# Patient Record
Sex: Female | Born: 1945 | Race: White | Hispanic: No | Marital: Married | Smoking: Former smoker
Health system: Southern US, Community
[De-identification: ages and names within clinical notes are randomized; demographics above are authoritative.]

## PROBLEM LIST (undated history)

## (undated) DIAGNOSIS — E785 Hyperlipidemia, unspecified: Secondary | ICD-10-CM

## (undated) DIAGNOSIS — C719 Malignant neoplasm of brain, unspecified: Secondary | ICD-10-CM

## (undated) HISTORY — PX: OTHER SURGICAL HISTORY: SHX169

## (undated) HISTORY — DX: Hyperlipidemia, unspecified: E78.5

---
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
## 1998-06-01 ENCOUNTER — Other Ambulatory Visit: Admission: RE | Admit: 1998-06-01 | Discharge: 1998-06-01 | Payer: Self-pay | Admitting: Obstetrics and Gynecology

## 1998-08-10 ENCOUNTER — Ambulatory Visit (HOSPITAL_COMMUNITY): Admission: RE | Admit: 1998-08-10 | Discharge: 1998-08-10 | Payer: Self-pay | Admitting: Obstetrics and Gynecology

## 1999-06-14 ENCOUNTER — Other Ambulatory Visit: Admission: RE | Admit: 1999-06-14 | Discharge: 1999-06-14 | Payer: Self-pay | Admitting: Obstetrics and Gynecology

## 2000-04-18 ENCOUNTER — Encounter (INDEPENDENT_AMBULATORY_CARE_PROVIDER_SITE_OTHER): Payer: Self-pay

## 2000-04-18 ENCOUNTER — Ambulatory Visit (HOSPITAL_COMMUNITY): Admission: RE | Admit: 2000-04-18 | Discharge: 2000-04-18 | Payer: Self-pay | Admitting: Obstetrics and Gynecology

## 2000-07-03 ENCOUNTER — Other Ambulatory Visit: Admission: RE | Admit: 2000-07-03 | Discharge: 2000-07-03 | Payer: Self-pay | Admitting: Obstetrics and Gynecology

## 2001-08-25 ENCOUNTER — Other Ambulatory Visit: Admission: RE | Admit: 2001-08-25 | Discharge: 2001-08-25 | Payer: Self-pay | Admitting: Obstetrics and Gynecology

## 2002-09-27 ENCOUNTER — Other Ambulatory Visit: Admission: RE | Admit: 2002-09-27 | Discharge: 2002-09-27 | Payer: Self-pay | Admitting: Obstetrics and Gynecology

## 2003-10-18 ENCOUNTER — Other Ambulatory Visit: Admission: RE | Admit: 2003-10-18 | Discharge: 2003-10-18 | Payer: Self-pay | Admitting: Obstetrics and Gynecology

## 2004-12-25 ENCOUNTER — Other Ambulatory Visit: Admission: RE | Admit: 2004-12-25 | Discharge: 2004-12-25 | Payer: Self-pay | Admitting: Obstetrics and Gynecology

## 2005-02-12 ENCOUNTER — Ambulatory Visit: Payer: Self-pay | Admitting: Endocrinology

## 2005-02-20 ENCOUNTER — Ambulatory Visit: Payer: Self-pay | Admitting: Gastroenterology

## 2005-03-25 ENCOUNTER — Ambulatory Visit: Payer: Self-pay | Admitting: Gastroenterology

## 2006-06-25 ENCOUNTER — Ambulatory Visit: Payer: Self-pay | Admitting: Endocrinology

## 2007-07-01 ENCOUNTER — Ambulatory Visit: Payer: Self-pay | Admitting: Endocrinology

## 2007-07-01 LAB — CONVERTED CEMR LAB
ALT: 34 units/L (ref 0–35)
Alkaline Phosphatase: 44 units/L (ref 39–117)
BUN: 14 mg/dL (ref 6–23)
Bilirubin, Direct: 0.1 mg/dL (ref 0.0–0.3)
Calcium: 9.6 mg/dL (ref 8.4–10.5)
GFR calc Af Amer: 132 mL/min
GFR calc non Af Amer: 109 mL/min
Glucose, Bld: 128 mg/dL — ABNORMAL HIGH (ref 70–99)
LDL Cholesterol: 95 mg/dL (ref 0–99)
Total CHOL/HDL Ratio: 2.4

## 2007-07-25 ENCOUNTER — Encounter: Payer: Self-pay | Admitting: *Deleted

## 2007-07-25 DIAGNOSIS — M810 Age-related osteoporosis without current pathological fracture: Secondary | ICD-10-CM | POA: Insufficient documentation

## 2007-07-25 DIAGNOSIS — E785 Hyperlipidemia, unspecified: Secondary | ICD-10-CM | POA: Insufficient documentation

## 2007-07-25 DIAGNOSIS — E139 Other specified diabetes mellitus without complications: Secondary | ICD-10-CM | POA: Insufficient documentation

## 2008-08-19 ENCOUNTER — Telehealth (INDEPENDENT_AMBULATORY_CARE_PROVIDER_SITE_OTHER): Payer: Self-pay | Admitting: *Deleted

## 2008-08-19 ENCOUNTER — Ambulatory Visit: Payer: Self-pay | Admitting: Endocrinology

## 2008-08-19 LAB — CONVERTED CEMR LAB
ALT: 30 units/L (ref 0–35)
AST: 25 units/L (ref 0–37)
Albumin: 4.5 g/dL (ref 3.5–5.2)
Alkaline Phosphatase: 45 units/L (ref 39–117)
BUN: 15 mg/dL (ref 6–23)
Bilirubin, Direct: 0.2 mg/dL (ref 0.0–0.3)
CO2: 31 meq/L (ref 19–32)
Chloride: 106 meq/L (ref 96–112)
Cholesterol: 181 mg/dL (ref 0–200)
Crystals: NEGATIVE
Glucose, Bld: 105 mg/dL — ABNORMAL HIGH (ref 70–99)
Ketones, ur: NEGATIVE mg/dL
LDL Cholesterol: 117 mg/dL — ABNORMAL HIGH (ref 0–99)
Mucus, UA: NEGATIVE
Potassium: 4.2 meq/L (ref 3.5–5.1)
RBC / HPF: NONE SEEN
Specific Gravity, Urine: <=1.005 (ref 1.000–1.03)
Total Protein, Urine: NEGATIVE mg/dL
Total Protein: 7.6 g/dL (ref 6.0–8.3)
Triglycerides: 46 mg/dL (ref 0–149)
Urine Glucose: NEGATIVE mg/dL
Urobilinogen, UA: 0.2 (ref 0.0–1.0)

## 2008-08-29 ENCOUNTER — Telehealth: Payer: Self-pay | Admitting: Endocrinology

## 2008-10-04 ENCOUNTER — Telehealth: Payer: Self-pay | Admitting: Endocrinology

## 2009-03-16 ENCOUNTER — Ambulatory Visit: Payer: Self-pay | Admitting: Family Medicine

## 2009-03-17 ENCOUNTER — Telehealth: Payer: Self-pay | Admitting: Family Medicine

## 2009-03-22 ENCOUNTER — Telehealth: Payer: Self-pay | Admitting: Family Medicine

## 2009-05-15 ENCOUNTER — Telehealth: Payer: Self-pay | Admitting: Family Medicine

## 2009-06-20 ENCOUNTER — Ambulatory Visit: Payer: Self-pay | Admitting: Family Medicine

## 2009-06-26 ENCOUNTER — Telehealth: Payer: Self-pay | Admitting: Family Medicine

## 2009-07-24 ENCOUNTER — Telehealth: Payer: Self-pay | Admitting: Family Medicine

## 2009-08-15 ENCOUNTER — Telehealth: Payer: Self-pay | Admitting: Family Medicine

## 2009-11-16 ENCOUNTER — Ambulatory Visit: Payer: Self-pay | Admitting: Family Medicine

## 2009-11-16 LAB — CONVERTED CEMR LAB
BUN: 7 mg/dL (ref 6–23)
CO2: 27 meq/L (ref 19–32)
Chloride: 101 meq/L (ref 96–112)
GFR calc non Af Amer: 107.66 mL/min (ref >60)
Glucose, Bld: 109 mg/dL — ABNORMAL HIGH (ref 70–99)
Potassium: 3.8 meq/L (ref 3.5–5.1)
Sodium: 137 meq/L (ref 135–145)

## 2009-11-22 ENCOUNTER — Ambulatory Visit: Payer: Self-pay | Admitting: Family Medicine

## 2010-04-16 ENCOUNTER — Ambulatory Visit: Payer: Self-pay | Admitting: Family Medicine

## 2010-04-16 LAB — CONVERTED CEMR LAB
Alkaline Phosphatase: 41 units/L (ref 39–117)
BUN: 16 mg/dL (ref 6–23)
Bilirubin Urine: NEGATIVE
Bilirubin, Direct: 0.1 mg/dL (ref 0.0–0.3)
Calcium: 9.2 mg/dL (ref 8.4–10.5)
Chloride: 112 meq/L (ref 96–112)
Cholesterol: 160 mg/dL (ref 0–200)
Creatinine, Ser: 0.5 mg/dL (ref 0.4–1.2)
Eosinophils Absolute: 0.1 10*3/uL (ref 0.0–0.7)
Eosinophils Relative: 1.6 % (ref 0.0–5.0)
HDL: 70.3 mg/dL (ref >39.00)
Hgb A1c MFr Bld: 5.9 % (ref 4.6–6.5)
Ketones, urine, test strip: NEGATIVE
LDL Cholesterol: 82 mg/dL (ref 0–99)
Lymphocytes Relative: 51 % — ABNORMAL HIGH (ref 12.0–46.0)
MCHC: 34.7 g/dL (ref 30.0–36.0)
MCV: 92.1 fL (ref 78.0–100.0)
Microalb Creat Ratio: 1.3 mg/g (ref 0.0–30.0)
Microalb, Ur: 0.5 mg/dL (ref 0.0–1.9)
Monocytes Absolute: 0.5 10*3/uL (ref 0.1–1.0)
Neutrophils Relative %: 36.9 % — ABNORMAL LOW (ref 43.0–77.0)
Nitrite: NEGATIVE
Platelets: 253 10*3/uL (ref 150.0–400.0)
Protein, U semiquant: NEGATIVE
RBC: 3.96 M/uL (ref 3.87–5.11)
Total Bilirubin: 0.6 mg/dL (ref 0.3–1.2)
Total CHOL/HDL Ratio: 2
Triglycerides: 37 mg/dL (ref 0.0–149.0)
Urobilinogen, UA: 0.2
VLDL: 7.4 mg/dL (ref 0.0–40.0)
WBC: 4.7 10*3/uL (ref 4.5–10.5)

## 2010-04-23 ENCOUNTER — Ambulatory Visit: Payer: Self-pay | Admitting: Family Medicine

## 2010-04-23 LAB — HM DIABETES FOOT EXAM: HM Diabetic Foot Exam: NORMAL

## 2010-08-15 ENCOUNTER — Telehealth: Payer: Self-pay | Admitting: Family Medicine

## 2010-10-15 ENCOUNTER — Ambulatory Visit: Payer: Self-pay | Admitting: Family Medicine

## 2010-10-17 LAB — CONVERTED CEMR LAB
CO2: 29 meq/L (ref 19–32)
Chloride: 108 meq/L (ref 96–112)
Creatinine, Ser: 0.7 mg/dL (ref 0.4–1.2)
Hgb A1c MFr Bld: 5.8 % (ref 4.6–6.5)
Potassium: 4.5 meq/L (ref 3.5–5.1)
Sodium: 143 meq/L (ref 135–145)

## 2010-10-24 ENCOUNTER — Ambulatory Visit: Payer: Self-pay | Admitting: Family Medicine

## 2010-12-02 LAB — CONVERTED CEMR LAB
BUN: 12 mg/dL (ref 6–23)
Basophils Relative: 0.3 % (ref 0.0–3.0)
Bilirubin Urine: NEGATIVE
CO2: 29 meq/L (ref 19–32)
Eosinophils Relative: 1.1 % (ref 0.0–5.0)
GFR calc non Af Amer: 90.31 mL/min (ref >60)
Glucose, Bld: 117 mg/dL — ABNORMAL HIGH (ref 70–99)
Glucose, Urine, Semiquant: NEGATIVE
HCT: 40.8 % (ref 36.0–46.0)
Ketones, urine, test strip: NEGATIVE
MCV: 92.7 fL (ref 78.0–100.0)
Monocytes Absolute: 0.4 10*3/uL (ref 0.1–1.0)
Monocytes Relative: 8.6 % (ref 3.0–12.0)
Neutrophils Relative %: 45.9 % (ref 43.0–77.0)
Platelets: 273 10*3/uL (ref 150.0–400.0)
Potassium: 4.5 meq/L (ref 3.5–5.1)
Protein, U semiquant: NEGATIVE
RBC: 4.41 M/uL (ref 3.87–5.11)
Sodium: 142 meq/L (ref 135–145)
Specific Gravity, Urine: 1.01
Urobilinogen, UA: 0.2
WBC: 5.1 10*3/uL (ref 4.5–10.5)

## 2010-12-06 NOTE — Assessment & Plan Note (Signed)
Summary: 6 month fup//ccm---PT [facility] // RS   Vital Signs:  Patient profile:   65 year old female Menstrual status:  postmenopausal Weight:      137 pounds Temp:     98.0 degrees F oral BP sitting:   100 / 68  (left arm) Cuff size:   regular CC: 6 mon rov   CC:  6 mon rov.  History of Present Illness: Diane Perez is a delightful, 65 year old, married female, nonsmoker, who comes in today for evaluation of diabetes, type II.  Her last physical examination and laboratory 2011 sugar blood sugar of 115 with a hemoglobin A1c of 5.7.  She's always been under excellent control.  She , not obese.  She takes metformin 500 mg daily  Review of systems otherwise negative  Allergies: No Known Drug Allergies  Past History:  Past medical, surgical, family and social histories (including risk factors) reviewed for relevance to current acute and chronic problems.  Past Medical History: Reviewed history from 07/25/2007 and no changes required. Diabetes mellitus, type II Hyperlipidemia Osteoporosis  Family History: Reviewed history from 03/16/2009 and no changes required. father died in his 20s of old age and asthma mother died in her 3s from underlying diabetes  one brother in good healthone sisters had bypass surgery at age 86.  The other in good health.  Social History: Reviewed history from 03/16/2009 and no changes required. Occupation:  travel agent Married Never Smoked Alcohol use-yes Drug use-no Regular exercise-yes  Physical Exam  General:  Well-developed,well-nourished,in no acute distress; alert,appropriate and cooperative throughout examination   Impression & Recommendations:  Problem # 1:  DIABETES MELLITUS, TYPE II (ICD-250.00) Assessment Improved  Her updated medication list for this problem includes:    Metformin Hcl 500 Mg Tb24 (Metformin hcl) .Marland Kitchen... Take 1 by mouth qd    Aspirin 81 Mg Tbec (Aspirin) .Marland Kitchen... Take one tab by mouth once daily  Complete Medication  List: 1)  Multivitamins Tabs (Multiple vitamin) .... Take 1 by mouth qd 2)  Metformin Hcl 500 Mg Tb24 (Metformin hcl) .... Take 1 by mouth qd 3)  Lovastatin 40 Mg Tabs (Lovastatin) .... Take 1 by mouth once daily 4)  Boniva 150 Mg Tabs (Ibandronate sodium) .... Take 1 by mouth once a month 5)  One Touch Delica Lancets Misc (Lancets) .... Use to test for glucose 6)  Onetouch Test Strp (Glucose blood) .... Use to test glucose  dx 250.0 7)  Ativan 0.5 Mg Tabs (Lorazepam) .Marland Kitchen.. 1 by mouth < flying 8)  Ra Calcium Plus Vitamin D 600-125 Mg-unit Tabs (Calcium-vitamin d) .... Take one tab by mouth once daily 9)  Aspirin 81 Mg Tbec (Aspirin) .... Take one tab by mouth once daily 10)  Fish Oil Oil (Fish oil) .... Take one tab by mouth once daily 11)  Vinegar  .... Take 2 tabs by mouth once daily  Patient Instructions: 1)  since your blood sugar is under such good control.  I would simply check a fasting blood sugar once a week. 2)  Return in June for your annual exam...250.00 3)  BMP prior to visit, ICD-9: 4)  Hepatic Panel prior to visit, ICD-9: 5)  Lipid Panel prior to visit, ICD-9: 6)  TSH prior to visit, ICD-9: 7)  CBC w/ Diff prior to visit, ICD-9: 8)  Urine-dip prior to visit, ICD-9: 9)  HbgA1C prior to visit, ICD-9: 10)  Urine Microalbumin prior to visit, ICD-9:   Orders Added: 1)  Est. Patient Level III [91478]

## 2010-12-06 NOTE — Progress Notes (Signed)
Summary: bs running in 150 range  Phone Note Call from Patient Call back at Home Phone [phone] Call back at Work Phone [phone]   Caller: Patient Call For: Roderick Pee MD Summary of Call: Pt bs is running in 150,  please advise Initial call taken by: Heron Sabins,  August 15, 2010 10:18 AM  Follow-up for Phone Call        advised to do continued at 500-mg dose before breakfast at 250 mg prior to her evening meal.  Fasting blood sugar q.a.m., fax me data in 3 weeks Follow-up by: Roderick Pee MD,  August 15, 2010 10:30 AM

## 2010-12-06 NOTE — Assessment & Plan Note (Signed)
Summary: cpx/mm   Vital Signs:  Patient profile:   65 year old female Menstrual status:  postmenopausal Height:      61.75 inches Weight:      132 pounds BMI:     24.43 Temp:     98.3 degrees F oral BP sitting:   94 / 68  (left arm) Cuff size:   regular  Vitals Entered By: Kern Reap CMA Duncan Dull) (April 23, 2010 9:41 AM) CC: cpx  Vision Screening:      Vision Comments: 05/2011   CC:  cpx.  History of Present Illness: Diane Perez is a 65 year old, married female, nonsmoker, who comes in today for follow-up of diabetes, hyper lipidemia, and osteoporosis.  Her diabetes is treated with metformin 500 mg daily.  She follows a strict diet and exercises on a regular basis.  Blood sugar 115.  Hemoglobin A1c of 5.9%.  She also takes the lovastatin 40 mg daily for hyperlipidemia.  Lipids are at goal  This is the third, year she's been on boniva  for osteoporosis.  Recommend she stop the boniva  follow-up bone density in two years  Tetanus booster 2010 pelvic by GYN, exam, July 2011, colonoscopy, May 2006 normal, annual mammography August 2011, normal.  She also takes calcium and vitamin D, and 81 mg baby aspirin daily  Diabetes Management History:      She says that she is exercising.    Allergies (verified): No Known Drug Allergies  Past History:  Past medical, surgical, family and social histories (including risk factors) reviewed, and no changes noted (except as noted below).  Past Medical History: Reviewed history from 07/25/2007 and no changes required. Diabetes mellitus, type II Hyperlipidemia Osteoporosis  Family History: Reviewed history from 03/16/2009 and no changes required. father died in his 63s of old age and asthma mother died in her 60s from underlying diabetes  one brother in good healthone sisters had bypass surgery at age 64.  The other in good health.  Social History: Reviewed history from 03/16/2009 and no changes required. Occupation:  travel  agent Married Never Smoked Alcohol use-yes Drug use-no Regular exercise-yes  Review of Systems      See HPI  Physical Exam  General:  Well-developed,well-nourished,in no acute distress; alert,appropriate and cooperative throughout examination Head:  Normocephalic and atraumatic without obvious abnormalities. No apparent alopecia or balding. Eyes:  No corneal or conjunctival inflammation noted. EOMI. Perrla. Funduscopic exam benign, without hemorrhages, exudates or papilledema. Vision grossly normal. Ears:  External ear exam shows no significant lesions or deformities.  Otoscopic examination reveals clear canals, tympanic membranes are intact bilaterally without bulging, retraction, inflammation or discharge. Hearing is grossly normal bilaterally. Nose:  External nasal examination shows no deformity or inflammation. Nasal mucosa are pink and moist without lesions or exudates. Mouth:  Oral mucosa and oropharynx without lesions or exudates.  Teeth in good repair. Neck:  No deformities, masses, or tenderness noted. Chest Wall:  No deformities, masses, or tenderness noted. Breasts:  No mass, nodules, thickening, tenderness, bulging, retraction, inflamation, nipple discharge or skin changes noted.   Lungs:  Normal respiratory effort, chest expands symmetrically. Lungs are clear to auscultation, no crackles or wheezes. Heart:  Normal rate and regular rhythm. S1 and S2 normal without gallop, murmur, click, rub or other extra sounds. Abdomen:  Bowel sounds positive,abdomen soft and non-tender without masses, organomegaly or hernias noted. Msk:  No deformity or scoliosis noted of thoracic or lumbar spine.   Pulses:  R and L carotid,radial,femoral,dorsalis pedis and  posterior tibial pulses are full and equal bilaterally Extremities:  No clubbing, cyanosis, edema, or deformity noted with normal full range of motion of all joints.   Neurologic:  No cranial nerve deficits noted. Station and gait are  normal. Plantar reflexes are down-going bilaterally. DTRs are symmetrical throughout. Sensory, motor and coordinative functions appear intact. Skin:  Intact without suspicious lesions or rashes Cervical Nodes:  No lymphadenopathy noted Axillary Nodes:  No palpable lymphadenopathy Inguinal Nodes:  No significant adenopathy Psych:  Cognition and judgment appear intact. Alert and cooperative with normal attention span and concentration. No apparent delusions, illusions, hallucinations  Diabetes Management Exam:    Foot Exam (with socks and/or shoes not present):       Sensory-Pinprick/Light touch:          Left medial foot (L-4): normal          Left dorsal foot (L-5): normal          Left lateral foot (S-1): normal          Right medial foot (L-4): normal          Right dorsal foot (L-5): normal          Right lateral foot (S-1): normal       Sensory-Monofilament:          Left foot: normal          Right foot: normal       Inspection:          Left foot: normal          Right foot: normal       Nails:          Left foot: normal          Right foot: normal    Eye Exam:       Eye Exam done elsewhere          Date: 05/04/2010          Results: normal          Done by: Ginette Otto ortho   Impression & Recommendations:  Problem # 1:  OSTEOPOROSIS (ICD-733.00) Assessment Improved  Her updated medication list for this problem includes:    Boniva 150 Mg Tabs (Ibandronate sodium) .Marland Kitchen... Take 1 by mouth once a month  Orders: Prescription Created Electronically [phone])  Problem # 2:  HYPERLIPIDEMIA (ICD-272.4) Assessment: Improved  Her updated medication list for this problem includes:    Lovastatin 40 Mg Tabs (Lovastatin) .Marland Kitchen... Take 1 by mouth once daily  Orders: Prescription Created Electronically [phone]) EKG w/ Interpretation (93000)  Problem # 3:  DIABETES MELLITUS, TYPE II (ICD-250.00) Assessment: Improved  Her updated medication list for this problem includes:     Metformin Hcl 500 Mg Tb24 (Metformin hcl) .Marland Kitchen... Take 1 by mouth qd    Aspirin 81 Mg Tbec (Aspirin) .Marland Kitchen... Take one tab by mouth once daily  Orders: Prescription Created Electronically [phone]) EKG w/ Interpretation (93000)  Complete Medication List: 1)  Multivitamins Tabs (Multiple vitamin) .... Take 1 by mouth qd 2)  Metformin Hcl 500 Mg Tb24 (Metformin hcl) .... Take 1 by mouth qd 3)  Lovastatin 40 Mg Tabs (Lovastatin) .... Take 1 by mouth once daily 4)  Boniva 150 Mg Tabs (Ibandronate sodium) .... Take 1 by mouth once a month 5)  One Touch Delica Lancets Misc (Lancets) .... Use to test for glucose 6)  Onetouch Test Strp (Glucose blood) .... Use to test glucose  dx 250.0 7)  Ativan  0.5 Mg Tabs (Lorazepam) .Marland Kitchen.. 1 by mouth < flying 8)  Ra Calcium Plus Vitamin D 600-125 Mg-unit Tabs (Calcium-vitamin d) .... Take one tab by mouth once daily 9)  Aspirin 81 Mg Tbec (Aspirin) .... Take one tab by mouth once daily 10)  Fish Oil Oil (Fish oil) .... Take one tab by mouth once daily 11)  Vinegar  .... Take 2 tabs by mouth once daily  Diabetes Management Assessment/Plan:      Her blood pressure goal is < 130/80.    Patient Instructions: 1)  Schedule your mammogram. 2)  Take calcium +Vitamin D daily. 3)  Take an Aspirin every day. 4)  Please schedule a follow-up appointment in 6 months. 250.00 5)  BMP prior to visit, ICD-9: 6)  HbgA1C prior to visit, ICD-9: 7)  Please schedule a follow-up appointment in 1 year. 8)  stop the boniva ........ continue calcium, vitamin D, and walking.... follow-up bone density in two years 9)  check your blood sugar weekly Prescriptions: ONETOUCH TEST  STRP (GLUCOSE BLOOD) use to test glucose  dx 250.0  #100 x 3   Entered and Authorized by:   Roderick Pee MD   Signed by:   Roderick Pee MD on 04/23/2010   Method used:   Electronically to        [facility] [phone]* (retail)       [address]       [place], [place]  [address]       Ph: [phone] or  [phone]       Fax: [phone]   RxID:   [id number] ONE TOUCH DELICA LANCETS  MISC (LANCETS) use to test for glucose  #100 x 3   Entered and Authorized by:   Roderick Pee MD   Signed by:   Roderick Pee MD on 04/23/2010   Method used:   Electronically to        [facility] [phone]* (retail)       [address]       [place], [place]  [address]       Ph: [phone] or [phone]       Fax: [phone]   RxID:   [id number] LOVASTATIN 40 MG  TABS (LOVASTATIN) take 1 by mouth once daily  #100 x 3   Entered and Authorized by:   Roderick Pee MD   Signed by:   Roderick Pee MD on 04/23/2010   Method used:   Electronically to        [facility] [phone]* (retail)       [address]       [place], [place]  [address]       Ph: [phone] or [phone]       Fax: [phone]   RxID:   [id number] METFORMIN HCL 500 MG  TB24 (METFORMIN HCL) take 1 by mouth qd  #100 x 3   Entered and Authorized by:   Roderick Pee MD   Signed by:   Roderick Pee MD on 04/23/2010   Method used:   Electronically to        [facility] [phone]* (retail)       [address]       [place], [place]  [address]       Ph: [phone] or [phone]       Fax: [phone]   RxID:   [id number]        Preventive  Care Screening  Mammogram:    Date:  06/04/2009    Results:  normal

## 2010-12-06 NOTE — Assessment & Plan Note (Signed)
Summary: 6 month rov/njr rsc with pt/mhf/pt rsc per Dr. Beatrix Fetters   Vital Signs:  Patient profile:   65 year old female Menstrual status:  postmenopausal Weight:      136 pounds Temp:     98.4 degrees F oral BP sitting:   98 / 68  (left arm)  Vitals Entered By: Kern Reap CMA Duncan Dull) (November 22, 2009 8:48 AM)  Reason for Visit follow up dm   History of Present Illness: S is a 65 year old, married female, nonsmoker, who comes in today for follow-up of diabetes.  Her fasting blood sugar averages about 110.  Her hemoglobin A1c today is 6.0.  She takes metformin 500 mg daily.  She also needs a refill on her boniva  She's also had a cough for 2 1/2 weeks.  No fever no sputum production.  She states every winter.  She will get a cough to last for 3 to 4 weeks.  No history of asthma nor allergy.  Environmental review of systems negative.  She's also requesting a sleeping pill because she and her husband are gone and take a plane trip to [place].  I advised against staying in position for more than two hours at a time.  Also advised against sleeping pills because of the issue of blood clots  Allergies: No Known Drug Allergies  Past History:  Past medical, surgical, family and social histories (including risk factors) reviewed for relevance to current acute and chronic problems.  Past Medical History: Reviewed history from 07/25/2007 and no changes required. Diabetes mellitus, type II Hyperlipidemia Osteoporosis  Family History: Reviewed history from 03/16/2009 and no changes required. father died in his 40s of old age and asthma mother died in her 4s from underlying diabetes  one brother in good healthone sisters had bypass surgery at age 68.  The other in good health.  Social History: Reviewed history from 03/16/2009 and no changes required. 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 No apparent alopecia or balding. Eyes:  No corneal or conjunctival inflammation noted. EOMI. Perrla. Funduscopic exam benign, without hemorrhages, exudates or papilledema. Vision grossly normal. Ears:  External ear exam shows no significant lesions or deformities.  Otoscopic examination reveals clear canals, tympanic membranes are intact bilaterally without bulging, retraction, inflammation or discharge. Hearing is grossly normal bilaterally. Nose:  External nasal examination shows no deformity or inflammation. Nasal mucosa are pink and moist without lesions or exudates. Mouth:  Oral mucosa and oropharynx without lesions or exudates.  Teeth in good repair. Neck:  No deformities, masses, or tenderness noted. Chest Wall:  No deformities, masses, or tenderness noted. Lungs:  Normal respiratory effort, chest expands symmetrically. Lungs are clear to auscultation, no crackles or wheezes.   Problems:  Medical Problems Added: 1)  Dx of Cough  (ICD-786.2)  Impression & Recommendations:  Problem # 1:  DIABETES MELLITUS, TYPE II (ICD-250.00) Assessment Improved  Her updated medication list for this problem includes:    Metformin Hcl 500 Mg Tb24 (Metformin hcl) .Marland Kitchen... Take 1 by mouth qd  Orders: Prescription Created Electronically [phone])  Problem # 2:  COUGH (ICD-786.2) Assessment: New  Orders: Prescription Created Electronically [phone])  Complete Medication List: 1)  Multivitamins Tabs (Multiple vitamin) .... Take 1 by mouth qd 2)  Calcium  500/d 500-200 Mg-unit Tabs (Calcium carbonate-vitamin d) .... Take 1 by mouth qd 3)  Metformin Hcl 500 Mg Tb24 (Metformin hcl) .... Take 1 by mouth qd 4)  Lovastatin 40 Mg Tabs (Lovastatin) .... Take 2 by mouth qd 5)  Boniva 150 Mg Tabs  (Ibandronate sodium) .... Take 1 by mouth once a month 6)  One Touch Delica Lancets Misc (Lancets) .... Use to test for glucose 7)  Onetouch Test Strp (Glucose blood) .... Use to test glucose  dx 250.0 8)  Ativan 0.5 Mg Tabs (Lorazepam) .Marland Kitchen.. 1 by mouth < flying  Patient Instructions: 1)  take one puff of the inhaled steroid twice a day until the cough goes away. 2)  Be sure to take an aspirin tablet before your fly and do not sit for more than two hours at a time. 3)  Return for your annual exam........250.00 4)  BMP prior to visit, ICD-9: 5)  Hepatic Panel prior to visit, ICD-9: 6)  Lipid Panel prior to visit, ICD-9: 7)  TSH prior to visit, ICD-9: 8)  CBC w/ Diff prior to visit, ICD-9: 9)  Urine-dip prior to visit, ICD-9: 10)  HbgA1C prior to visit, ICD-9: 11)  Urine Microalbumin prior to visit, ICD-9: Prescriptions: BONIVA 150 MG  TABS (IBANDRONATE SODIUM) take 1 by mouth once a month  #12 x 1   Entered and Authorized by:   Roderick Pee MD   Signed by:   Roderick Pee MD on 11/22/2009   Method used:   Electronically to        [facility] [phone]* (retail)       [address]       [place], [place]  [address]       Ph: [phone] or [phone]       Fax: [phone]   RxID:   [id number] ATIVAN 0.5 MG TABS (LORAZEPAM) 1 by mouth < flying  #10 x 0   Entered and Authorized by:   Roderick Pee MD   Signed by:   Roderick Pee MD on 11/22/2009   Method used:   Print then Give to Patient   RxID:   [id number]

## 2010-12-30 ENCOUNTER — Other Ambulatory Visit: Payer: Self-pay | Admitting: Family Medicine

## 2011-03-22 NOTE — Op Note (Signed)
[facility]  Patient:    Diane Perez, Diane Perez                 MRN: [id number] Proc. Date: 04/18/00 Adm. Date:  [id number] Disc. Date: [id number] Attending:  Rhina Brackett                           Operative Report  PREOPERATIVE DIAGNOSIS:       Abnormal uterine bleeding with endometrial polyps.  POSTOPERATIVE DIAGNOSIS:      Abnormal uterine bleeding with endometrial polyps.  PROCEDURE:                    Dilation and curettage hysteroscopy with removal of endometrial polyps.  SURGEON:                      Duke Salvia. Marcelle Overlie, M.D.  ANESTHESIA:                   MAC with paracervical block.  PROCEDURE AND FINDINGS:       The patient was taken to the operating room and, after and adequate level MAC sedation was obtained with the patients legs in the stirrups, the perineum and vagina were prepped and draped in the usual manner for D&C.  The bladder was drained and EUA carried out.  The uterus was anterior and normal size, adnexa negative.  A speculum was positioned and the cervix grasped with a tenaculum.  A paracervical block was then created by infiltrating at 3 and 9 oclock submucosally with 5-7 cc of 1% Xylocaine on either side after negative aspiration.  The uterus was then sounded to 9 cm and progressively dilated to a 32 Pratt dilator.  The continuous flow hysteroscope was then inserted.  An endometrial polyp was noted in the right cornual area with some more moderately abundant tissue in the left cornu, all of which appeared to be normal tissue.  The hysteroscope was removed.  D&C was carried out.  A moderate amount of polyp fragments were removed.  The scope was then reinserted.  The right cornual polyp could be identified and was removed by grasping with the polyp forceps separately.  The scope was then reinserted and the cavity was noted to be clean.  There was minimal bleeding. She tolerated this well and went to the recovery room  in good condition. DD:  04/18/00 TD:  04/21/00 Job: [id number]

## 2011-03-22 NOTE — H&P (Signed)
[facility]  Patient:    Diane Perez, Diane Perez                   MRN: [id number] Adm. Date:  04/18/00 Attending:  Duke Salvia. Marcelle Overlie, M.D.                         History and Physical  CHIEF COMPLAINT:              Abnormal uterine bleeding.  HISTORY OF PRESENT ILLNESS:   The patient is a 65 year old G0, P0 on Prempro 0.625/2.5 with abnormal uterine bleeding.  She underwent SHT in the office on February 27, 2000 that showed several endometrial polyps.  She presents now for hysteroscopy D&C.  This procedure including the risks relative to bleeding, infection, perforation that may require open or additional surgery were all discussed with her, which she understands and accepts.  ALLERGIES:                    None.  PAST SURGICAL HISTORY:        None.  PAST OBSTETRIC HISTORY:       No prior pregnancies.  SOCIAL HISTORY:               Occasional alcohol use.  She denies smoking.  PHYSICAL EXAMINATION:  VITAL SIGNS:                  Temperature 98.2, blood pressure 120/60.  HEENT:                        Unremarkable.  NECK:                         Supple without masses.  LUNGS:                        Clear.  CARDIOVASCULAR:               Regular rate and rhythm otherwise murmurs, rubs, or gallops noted.  BREASTS:                      Without masses.  ABDOMEN:                      Soft, flat and nontender.  PELVIC:                       Normal external genitalia.  The vagina and cervix are clear.  The uterus is anterior and normal size.  Adnexa negative. Rectovaginal confirms.  IMPRESSION:                   1. Abnormal uterine bleeding on hormone                                  replacement therapy.                               2. Endometrial polyps.  PLAN:                         Dilation and curettage hysteroscopy.  The procedure and risks were discussed as above. DD:  04/18/00  TD:  04/21/00 Job: [id number]

## 2011-04-06 ENCOUNTER — Other Ambulatory Visit: Payer: Self-pay | Admitting: Family Medicine

## 2011-04-18 ENCOUNTER — Other Ambulatory Visit (INDEPENDENT_AMBULATORY_CARE_PROVIDER_SITE_OTHER)

## 2011-04-18 DIAGNOSIS — Z0000 Encounter for general adult medical examination without abnormal findings: Secondary | ICD-10-CM

## 2011-04-18 LAB — POCT URINALYSIS DIPSTICK
Bilirubin, UA: NEGATIVE
Glucose, UA: NEGATIVE
Ketones, UA: NEGATIVE
Leukocytes, UA: NEGATIVE
Nitrite, UA: NEGATIVE
Protein, UA: NEGATIVE
Spec Grav, UA: 1.015
Urobilinogen, UA: 0.2
pH, UA: 7

## 2011-04-18 LAB — CBC WITH DIFFERENTIAL/PLATELET
Basophils Relative: 0.6 % (ref 0.0–3.0)
Eosinophils Absolute: 0 10*3/uL (ref 0.0–0.7)
HCT: 38.2 % (ref 36.0–46.0)
Hemoglobin: 12.8 g/dL (ref 12.0–15.0)
Lymphocytes Relative: 46.3 % — ABNORMAL HIGH (ref 12.0–46.0)
Lymphs Abs: 2.3 10*3/uL (ref 0.7–4.0)
MCHC: 33.4 g/dL (ref 30.0–36.0)
Monocytes Relative: 8.6 % (ref 3.0–12.0)
Neutro Abs: 2.1 10*3/uL (ref 1.4–7.7)
RBC: 4.06 Mil/uL (ref 3.87–5.11)
RDW: 14 % (ref 11.5–14.6)

## 2011-04-18 LAB — BASIC METABOLIC PANEL
CO2: 29 mEq/L (ref 19–32)
Calcium: 9.5 mg/dL (ref 8.4–10.5)
Chloride: 111 mEq/L (ref 96–112)
Glucose, Bld: 113 mg/dL — ABNORMAL HIGH (ref 70–99)
Potassium: 4.7 mEq/L (ref 3.5–5.1)
Sodium: 145 mEq/L (ref 135–145)

## 2011-04-18 LAB — MICROALBUMIN / CREATININE URINE RATIO
Microalb Creat Ratio: 0.2 mg/g (ref 0.0–30.0)
Microalb, Ur: 0.1 mg/dL (ref 0.0–1.9)

## 2011-04-18 LAB — LIPID PANEL
Cholesterol: 163 mg/dL (ref 0–200)
HDL: 68.6 mg/dL
LDL Cholesterol: 84 mg/dL (ref 0–99)
Total CHOL/HDL Ratio: 2
Triglycerides: 54 mg/dL (ref 0.0–149.0)
VLDL: 10.8 mg/dL (ref 0.0–40.0)

## 2011-04-18 LAB — HEPATIC FUNCTION PANEL
AST: 19 U/L (ref 0–37)
Albumin: 4.5 g/dL (ref 3.5–5.2)
Alkaline Phosphatase: 53 U/L (ref 39–117)
Total Protein: 7.2 g/dL (ref 6.0–8.3)

## 2011-04-18 LAB — HEMOGLOBIN A1C: Hgb A1c MFr Bld: 6 % (ref 4.6–6.5)

## 2011-04-18 LAB — TSH: TSH: 1.77 u[IU]/mL (ref 0.35–5.50)

## 2011-04-24 ENCOUNTER — Encounter: Payer: Self-pay | Admitting: Family Medicine

## 2011-04-25 ENCOUNTER — Ambulatory Visit (INDEPENDENT_AMBULATORY_CARE_PROVIDER_SITE_OTHER): Admitting: Family Medicine

## 2011-04-25 ENCOUNTER — Encounter: Payer: Self-pay | Admitting: Family Medicine

## 2011-04-25 DIAGNOSIS — E119 Type 2 diabetes mellitus without complications: Secondary | ICD-10-CM

## 2011-04-25 DIAGNOSIS — E785 Hyperlipidemia, unspecified: Secondary | ICD-10-CM

## 2011-04-25 MED ORDER — LORAZEPAM 0.5 MG PO TABS: 0.5000 mg | ORAL_TABLET | Freq: Three times a day (TID) | ORAL | Status: DC | PRN

## 2011-04-25 MED ORDER — LOVASTATIN 40 MG PO TABS: 40.0000 mg | ORAL_TABLET | Freq: Every day | ORAL | Status: DC

## 2011-04-25 MED ORDER — METFORMIN HCL ER 500 MG PO TB24: 500.0000 mg | ORAL_TABLET | Freq: Every day | ORAL | Status: DC

## 2011-04-25 NOTE — Progress Notes (Signed)
  Subjective:    Patient ID: Diane Perez, female    DOB: [birth year], 65 y.o.   MRN: [id number]  Diane Perez Is a 65 year old, married female, nonsmoker, travel agent, who comes in today for general physical examination because her history of underlying diabetes and hyperlipidemia.  Her diabetes is managed with metformin 500 mg.  Daily blood sugar normal A1c6 .0%.  No hypoglycemia.  She takes Mevacor 40 nightly for hyperlipidemia.  Lipids.  Her goal.  She takes aspirin, calcium, vitamin D, walks on a regular basis.  Because of her travel.  She takes Ativan .5 p.r.n.  She gets routine eye care, dental care, BSE occasionally, and a mammography, colonoscopy, normal, tetanus, 2010, information given on shingles,    Review of Systems  Constitutional: Negative.   HENT: Negative.   Eyes: Negative.   Respiratory: Negative.   Cardiovascular: Negative.   Gastrointestinal: Negative.   Genitourinary: Negative.   Musculoskeletal: Negative.   Neurological: Negative.   Hematological: Negative.   Psychiatric/Behavioral: Negative.        Objective:   Physical Exam  Constitutional: She appears well-developed and well-nourished.  HENT:  Head: Normocephalic and atraumatic.  Right Ear: External ear normal.  Left Ear: External ear normal.  Nose: Nose normal.  Mouth/Throat: Oropharynx is clear and moist.  Eyes: EOM are normal. Pupils are equal, round, and reactive to light.  Neck: Normal range of motion. Neck supple. No thyromegaly present.  Cardiovascular: Normal rate, regular rhythm, normal heart sounds and intact distal pulses.  Exam reveals no gallop and no friction rub.   No murmur heard. Pulmonary/Chest: Effort normal and breath sounds normal.  Abdominal: Soft. Bowel sounds are normal. She exhibits no distension and no mass. There is no tenderness. There is no rebound.  Genitourinary:       Bilateral breast exam normal  Musculoskeletal: Normal range of motion.    Lymphadenopathy:    She has no cervical adenopathy.  Neurological: She is alert. She has normal reflexes. No cranial nerve deficit. She exhibits normal muscle tone. Coordination normal.  Skin: Skin is warm and dry.  Psychiatric: She has a normal mood and affect. Her behavior is normal. Judgment and thought content normal.          Assessment & Plan:  Diabetes type II well controlled with metformin 500 daily continue current therapy.  Hyperlipidemia.  Continue Mevacor 40 daily.  Continue aspirin, calcium, vitamin D, and exercise return in one year, sooner if any problems

## 2011-04-25 NOTE — Patient Instructions (Signed)
Continue your current medications.  Follow-up in one year or sooner if any problems.  Remember to see Dr. Vonna Kotyk for an annual eye exam and consider the shingles  vaccinations

## 2011-08-20 ENCOUNTER — Telehealth: Payer: Self-pay | Admitting: Family Medicine

## 2011-08-20 NOTE — Telephone Encounter (Signed)
We would recommend physical therapy............ Please call her tomorrow and get this set up with Jeanene Erb, physical therapist with [facility]

## 2011-08-20 NOTE — Telephone Encounter (Signed)
Pt is having back pain and is hoping you could recommend a chiropractor pt requesting you contact her.

## 2011-08-21 NOTE — Telephone Encounter (Signed)
Spoke with patient.

## 2011-08-22 ENCOUNTER — Other Ambulatory Visit: Payer: Self-pay | Admitting: Family Medicine

## 2011-08-22 DIAGNOSIS — M810 Age-related osteoporosis without current pathological fracture: Secondary | ICD-10-CM

## 2011-08-22 DIAGNOSIS — M549 Dorsalgia, unspecified: Secondary | ICD-10-CM

## 2011-10-01 ENCOUNTER — Other Ambulatory Visit: Payer: Self-pay | Admitting: Family Medicine

## 2011-11-19 ENCOUNTER — Other Ambulatory Visit: Payer: Self-pay | Admitting: *Deleted

## 2011-11-19 DIAGNOSIS — E119 Type 2 diabetes mellitus without complications: Secondary | ICD-10-CM

## 2011-11-19 MED ORDER — LOVASTATIN 40 MG PO TABS: 40.0000 mg | ORAL_TABLET | Freq: Every day | ORAL | Status: DC

## 2011-11-19 MED ORDER — METFORMIN HCL ER 500 MG PO TB24: 500.0000 mg | ORAL_TABLET | Freq: Every day | ORAL | Status: DC

## 2012-08-11 ENCOUNTER — Other Ambulatory Visit: Payer: Self-pay | Admitting: Family Medicine

## 2012-08-26 ENCOUNTER — Other Ambulatory Visit: Payer: Self-pay | Admitting: *Deleted

## 2012-08-26 MED ORDER — LOVASTATIN 40 MG PO TABS: 40.0000 mg | ORAL_TABLET | Freq: Every day | ORAL | Status: DC

## 2012-11-19 ENCOUNTER — Other Ambulatory Visit: Payer: Self-pay | Admitting: Family Medicine

## 2012-12-15 ENCOUNTER — Telehealth: Payer: Self-pay | Admitting: *Deleted

## 2012-12-15 NOTE — Telephone Encounter (Signed)
Patient is requesting a referral for dermatology.  Referral okay?

## 2012-12-15 NOTE — Telephone Encounter (Signed)
Diane Perez

## 2012-12-16 NOTE — Telephone Encounter (Signed)
Spoke with patient.

## 2013-01-07 DIAGNOSIS — D235 Other benign neoplasm of skin of trunk: Secondary | ICD-10-CM | POA: Diagnosis not present

## 2013-04-05 ENCOUNTER — Other Ambulatory Visit: Payer: Self-pay | Admitting: Family Medicine

## 2013-04-09 ENCOUNTER — Other Ambulatory Visit: Payer: Self-pay | Admitting: Family Medicine

## 2013-06-09 ENCOUNTER — Other Ambulatory Visit: Payer: Self-pay

## 2013-06-18 ENCOUNTER — Other Ambulatory Visit: Payer: Self-pay | Admitting: Family Medicine

## 2013-08-02 ENCOUNTER — Encounter: Payer: Self-pay | Admitting: Family Medicine

## 2013-09-09 ENCOUNTER — Other Ambulatory Visit: Payer: Self-pay

## 2013-09-28 DIAGNOSIS — Z1231 Encounter for screening mammogram for malignant neoplasm of breast: Secondary | ICD-10-CM | POA: Diagnosis not present

## 2013-09-28 DIAGNOSIS — Z130 Encounter for screening for diseases of the blood and blood-forming organs and certain disorders involving the immune mechanism: Secondary | ICD-10-CM | POA: Diagnosis not present

## 2013-09-28 DIAGNOSIS — M899 Disorder of bone, unspecified: Secondary | ICD-10-CM | POA: Diagnosis not present

## 2013-09-28 DIAGNOSIS — Z124 Encounter for screening for malignant neoplasm of cervix: Secondary | ICD-10-CM | POA: Diagnosis not present

## 2013-09-28 DIAGNOSIS — N959 Unspecified menopausal and perimenopausal disorder: Secondary | ICD-10-CM | POA: Diagnosis not present

## 2013-10-03 LAB — HM PAP SMEAR: HM Pap smear: NORMAL

## 2013-10-17 LAB — HM DEXA SCAN

## 2013-10-20 LAB — HM MAMMOGRAPHY: HM Mammogram: NORMAL

## 2013-12-06 ENCOUNTER — Other Ambulatory Visit: Payer: Self-pay | Admitting: Family Medicine

## 2013-12-06 DIAGNOSIS — E785 Hyperlipidemia, unspecified: Secondary | ICD-10-CM

## 2013-12-07 MED ORDER — LORAZEPAM 0.5 MG PO TABS: 0.5000 mg | ORAL_TABLET | Freq: Three times a day (TID) | ORAL | Status: DC | PRN

## 2013-12-07 MED ORDER — LOVASTATIN 40 MG PO TABS: ORAL_TABLET | ORAL | Status: DC

## 2013-12-07 NOTE — Addendum Note (Signed)
Addended by: Westley Hummer B on: 12/07/2013 03:30 PM   Modules accepted: Orders

## 2014-01-19 ENCOUNTER — Ambulatory Visit (INDEPENDENT_AMBULATORY_CARE_PROVIDER_SITE_OTHER): Payer: Medicare Other | Admitting: Family Medicine

## 2014-01-19 ENCOUNTER — Encounter: Payer: Self-pay | Admitting: Family Medicine

## 2014-01-19 ENCOUNTER — Telehealth: Payer: Self-pay | Admitting: Family Medicine

## 2014-01-19 VITALS — BP 120/78 | Temp 98.6°F | Wt 138.0 lb

## 2014-01-19 DIAGNOSIS — E119 Type 2 diabetes mellitus without complications: Secondary | ICD-10-CM | POA: Diagnosis not present

## 2014-01-19 DIAGNOSIS — J069 Acute upper respiratory infection, unspecified: Secondary | ICD-10-CM | POA: Diagnosis not present

## 2014-01-19 DIAGNOSIS — E785 Hyperlipidemia, unspecified: Secondary | ICD-10-CM

## 2014-01-19 DIAGNOSIS — B9789 Other viral agents as the cause of diseases classified elsewhere: Principal | ICD-10-CM

## 2014-01-19 LAB — BASIC METABOLIC PANEL
BUN: 11 mg/dL (ref 6–23)
CHLORIDE: 103 meq/L (ref 96–112)
CO2: 27 meq/L (ref 19–32)
Calcium: 9.5 mg/dL (ref 8.4–10.5)
Creatinine, Ser: 0.7 mg/dL (ref 0.4–1.2)
GFR: 87.48 mL/min (ref >60.00)
GLUCOSE: 87 mg/dL (ref 70–99)
POTASSIUM: 3.9 meq/L (ref 3.5–5.1)
SODIUM: 140 meq/L (ref 135–145)

## 2014-01-19 LAB — TSH: TSH: 1.68 u[IU]/mL (ref 0.35–5.50)

## 2014-01-19 LAB — LIPID PANEL
CHOLESTEROL: 150 mg/dL (ref 0–200)
HDL: 57.3 mg/dL (ref >39.00)
LDL CALC: 80 mg/dL (ref 0–99)
Total CHOL/HDL Ratio: 3
Triglycerides: 63 mg/dL (ref 0.0–149.0)
VLDL: 12.6 mg/dL (ref 0.0–40.0)

## 2014-01-19 LAB — CBC WITH DIFFERENTIAL/PLATELET
BASOS PCT: 0.1 % (ref 0.0–3.0)
Basophils Absolute: 0 10*3/uL (ref 0.0–0.1)
EOS PCT: 0.3 % (ref 0.0–5.0)
Eosinophils Absolute: 0 10*3/uL (ref 0.0–0.7)
HCT: 39.2 % (ref 36.0–46.0)
Hemoglobin: 13 g/dL (ref 12.0–15.0)
LYMPHS PCT: 23.2 % (ref 12.0–46.0)
Lymphs Abs: 2.5 10*3/uL (ref 0.7–4.0)
MCHC: 33.1 g/dL (ref 30.0–36.0)
MCV: 91.7 fl (ref 78.0–100.0)
Monocytes Absolute: 1.1 10*3/uL — ABNORMAL HIGH (ref 0.1–1.0)
Monocytes Relative: 9.9 % (ref 3.0–12.0)
Neutro Abs: 7.2 10*3/uL (ref 1.4–7.7)
Neutrophils Relative %: 66.5 % (ref 43.0–77.0)
Platelets: 355 10*3/uL (ref 150.0–400.0)
RBC: 4.27 Mil/uL (ref 3.87–5.11)
RDW: 13.4 % (ref 11.5–14.6)
WBC: 10.8 10*3/uL — AB (ref 4.5–10.5)

## 2014-01-19 LAB — POCT URINALYSIS DIPSTICK
Bilirubin, UA: NEGATIVE
Glucose, UA: NEGATIVE
Ketones, UA: NEGATIVE
Leukocytes, UA: NEGATIVE
NITRITE UA: NEGATIVE
PH UA: 7
Protein, UA: NEGATIVE
SPEC GRAV UA: 1.015
UROBILINOGEN UA: 0.2

## 2014-01-19 LAB — HEPATIC FUNCTION PANEL
ALT: 21 U/L (ref 0–35)
AST: 17 U/L (ref 0–37)
Albumin: 4.4 g/dL (ref 3.5–5.2)
Alkaline Phosphatase: 53 U/L (ref 39–117)
BILIRUBIN TOTAL: 1 mg/dL (ref 0.3–1.2)
Bilirubin, Direct: 0.1 mg/dL (ref 0.0–0.3)
TOTAL PROTEIN: 7.4 g/dL (ref 6.0–8.3)

## 2014-01-19 LAB — MICROALBUMIN / CREATININE URINE RATIO
Creatinine,U: 44.3 mg/dL
Microalb Creat Ratio: 0.7 mg/g (ref 0.0–30.0)
Microalb, Ur: 0.3 mg/dL (ref 0.0–1.9)

## 2014-01-19 LAB — HEMOGLOBIN A1C: HEMOGLOBIN A1C: 6.2 % (ref 4.6–6.5)

## 2014-01-19 MED ORDER — HYDROCODONE-HOMATROPINE 5-1.5 MG/5ML PO SYRP: 5.0000 mL | ORAL_SOLUTION | Freq: Three times a day (TID) | ORAL | Status: DC | PRN

## 2014-01-19 MED ORDER — METFORMIN HCL ER 500 MG PO TB24: ORAL_TABLET | ORAL | Status: DC

## 2014-01-19 MED ORDER — LOVASTATIN 40 MG PO TABS: ORAL_TABLET | ORAL | Status: DC

## 2014-01-19 NOTE — Progress Notes (Signed)
   Subjective:    Patient ID: Diane Perez, female    DOB: 10/29/1946, 68 y.o.   MRN: [id number]  HPI  Diane Perez is a 68 year old female who comes in today for evaluation of a viral illness  She's had head congestion sore throat cough for 10 days no fever no sputum production no wheezing  She takes metformin 500 mg daily for diabetes last A1c was 2 years ago.  Review of Systems Review of systems otherwise negative    Objective:   Physical Exam Well-developed and nourished female no acute distress vital signs stable she is afebrile HEENT negative neck was supple no adenopathy lungs are clear       Assessment & Plan:  Viral syndrome........ treat symptomatically  Diabetes check labs

## 2014-01-19 NOTE — Telephone Encounter (Signed)
Pt has sore throat, can't swallow, cough for 5 days and would like to see dr todd today

## 2014-01-19 NOTE — Patient Instructions (Signed)
Drink lots of water  Hydromet..............Marland Kitchen 1/2-1 teaspoon 3 times daily when necessary for cough and cold  Labs today  I will call you the reports  Set up at that time in the next month or 2 for general physical exam

## 2014-01-19 NOTE — Telephone Encounter (Signed)
Please call patient okay to work patient in today

## 2014-01-19 NOTE — Telephone Encounter (Signed)
Pt scheduled 11:15 today

## 2014-01-19 NOTE — Progress Notes (Signed)
Pre visit review using our clinic review tool, if applicable. No additional management support is needed unless otherwise documented below in the visit note. 

## 2014-02-14 ENCOUNTER — Ambulatory Visit (INDEPENDENT_AMBULATORY_CARE_PROVIDER_SITE_OTHER): Payer: Medicare Other | Admitting: Family Medicine

## 2014-02-14 ENCOUNTER — Encounter: Payer: Self-pay | Admitting: Family Medicine

## 2014-02-14 VITALS — BP 110/70 | Temp 97.8°F | Ht 61.75 in | Wt 138.0 lb

## 2014-02-14 DIAGNOSIS — G4700 Insomnia, unspecified: Secondary | ICD-10-CM | POA: Diagnosis not present

## 2014-02-14 DIAGNOSIS — E119 Type 2 diabetes mellitus without complications: Secondary | ICD-10-CM | POA: Diagnosis not present

## 2014-02-14 DIAGNOSIS — E785 Hyperlipidemia, unspecified: Secondary | ICD-10-CM | POA: Diagnosis not present

## 2014-02-14 DIAGNOSIS — Z23 Encounter for immunization: Secondary | ICD-10-CM | POA: Diagnosis not present

## 2014-02-14 MED ORDER — LOVASTATIN 40 MG PO TABS: ORAL_TABLET | ORAL | Status: DC

## 2014-02-14 MED ORDER — TRAZODONE HCL 50 MG PO TABS: 25.0000 mg | ORAL_TABLET | Freq: Every evening | ORAL | Status: DC | PRN

## 2014-02-14 MED ORDER — METFORMIN HCL ER 500 MG PO TB24: ORAL_TABLET | ORAL | Status: DC

## 2014-02-14 NOTE — Progress Notes (Signed)
Pre visit review using our clinic review tool, if applicable. No additional management support is needed unless otherwise documented below in the visit note. 

## 2014-02-14 NOTE — Patient Instructions (Signed)
Continue current medications  Walk 30 minutes daily  Since your blood sugar is under such good control followup in March 2016  No caffeine after noon  It's asleep this function persists I would recommend Desyrel 50 mg one tablet daily at bedtime,,,,,,,, this medicine may take 3-4 weeks to work  Dr. Rodena Goldmann

## 2014-02-14 NOTE — Progress Notes (Signed)
   Subjective:    Patient ID: Diane Perez, female    DOB: 09-14-1946, 68 y.o.   MRN: [id number]  HPI S.  is a 68 year old married female nonsmoker who comes in today for a general Medicare wellness examination because of a history of diabetes, sleep dysfunction new problem, hyperlipidemia  She takes metformin 500 mg daily A1c below 7.0%  She takes Ativan when necessary for traveling and flying.  For the past 6 months she's having difficulty sleeping. She is to go to sleep but then wakes up entangle back to sleep  She has chocolate in the evening. Denies any stress.  She's had a bone density in the past which is normal she takes calcium daily and walks daily  Showed a pelvic and Pap year ago by GYN never had any problem with through menopause at age 65 asymptomatic therefore, and recommendations every 3 years  She gets routine eye care, dental care, BSE monthly, and you mammography, colonoscopy normal in GI  She went for a dermatology check last year. I explained to her we do that here.  She's due for Pneumovax 23 in his shingles vaccine she is very reluctant to get vaccinated.  Cognitive function normal she walks on a regular basis home health safety reviewed no issues identified, no guns in the house, she does have a health care power of attorney and living well  Review of Systems  Constitutional: Negative.   HENT: Negative.   Eyes: Negative.   Respiratory: Negative.   Cardiovascular: Negative.   Gastrointestinal: Negative.   Genitourinary: Negative.   Musculoskeletal: Negative.   Neurological: Negative.   Psychiatric/Behavioral: Negative.        Objective:   Physical Exam  Nursing note and vitals reviewed. Constitutional: She appears well-developed and well-nourished.  HENT:  Head: Normocephalic and atraumatic.  Right Ear: External ear normal.  Left Ear: External ear normal.  Nose: Nose normal.  Mouth/Throat: Oropharynx is clear and moist.  Eyes: EOM are  normal. Pupils are equal, round, and reactive to light.  Neck: Normal range of motion. Neck supple. No thyromegaly present.  Cardiovascular: Normal rate, regular rhythm, normal heart sounds and intact distal pulses.  Exam reveals no gallop and no friction rub.   No murmur heard. No carotid aortic bruits peripheral pulses 2+ and symmetrical  Pulmonary/Chest: Effort normal and breath sounds normal.  Abdominal: Soft. Bowel sounds are normal. She exhibits no distension and no mass. There is no tenderness. There is no rebound.  Genitourinary:  Bilateral breast exam normal advise BSE monthly and annual mammography  Musculoskeletal: Normal range of motion.  Lymphadenopathy:    She has no cervical adenopathy.  Neurological: She is alert. She has normal reflexes. No cranial nerve deficit. She exhibits normal muscle tone. Coordination normal.  Skin: Skin is warm and dry.  Total body skin exam normal except for a seborrheic keratosis left lateral chest wall.  Psychiatric: She has a normal mood and affect. Her behavior is normal. Judgment and thought content normal.          Assessment & Plan:  Diabetes type 2 at goal continue current therapy followup in one year  Hyperlipidemia continue Mevacor followup in one year  Anxiety related flying Ativan 0.5 when necessary  Sleep dysfunction,,,,,,,,,, avoid caffeine,,,,, if symptoms persist Ni[place] 50 each bedtime,

## 2014-03-01 ENCOUNTER — Telehealth: Payer: Self-pay

## 2014-03-01 NOTE — Telephone Encounter (Signed)
Relevant patient education assigned to patient using Emmi. ° °

## 2014-03-07 ENCOUNTER — Encounter: Payer: Self-pay | Admitting: Family Medicine

## 2014-04-03 ENCOUNTER — Other Ambulatory Visit: Payer: Self-pay | Admitting: Family Medicine

## 2014-04-15 DIAGNOSIS — E119 Type 2 diabetes mellitus without complications: Secondary | ICD-10-CM | POA: Diagnosis not present

## 2014-04-15 DIAGNOSIS — H2510 Age-related nuclear cataract, unspecified eye: Secondary | ICD-10-CM | POA: Diagnosis not present

## 2014-04-15 DIAGNOSIS — H18419 Arcus senilis, unspecified eye: Secondary | ICD-10-CM | POA: Diagnosis not present

## 2014-04-15 DIAGNOSIS — H02839 Dermatochalasis of unspecified eye, unspecified eyelid: Secondary | ICD-10-CM | POA: Diagnosis not present

## 2014-04-26 ENCOUNTER — Encounter: Payer: Self-pay | Admitting: Family Medicine

## 2014-06-27 ENCOUNTER — Encounter: Payer: Self-pay | Admitting: Family Medicine

## 2014-08-08 ENCOUNTER — Encounter: Payer: Self-pay | Admitting: Family Medicine

## 2014-08-08 ENCOUNTER — Ambulatory Visit (INDEPENDENT_AMBULATORY_CARE_PROVIDER_SITE_OTHER): Payer: Medicare Other | Admitting: Family Medicine

## 2014-08-08 VITALS — BP 130/80 | Temp 98.0°F | Wt 131.0 lb

## 2014-08-08 DIAGNOSIS — E139 Other specified diabetes mellitus without complications: Secondary | ICD-10-CM

## 2014-08-08 DIAGNOSIS — Z23 Encounter for immunization: Secondary | ICD-10-CM

## 2014-08-08 NOTE — Progress Notes (Signed)
Pre visit review using our clinic review tool, if applicable. No additional management support is needed unless otherwise documented below in the visit note. 

## 2014-08-08 NOTE — Patient Instructions (Signed)
Continue your current medications  Labs today  We will get you set up with a neurologist for further evaluation

## 2014-08-08 NOTE — Progress Notes (Signed)
   Subjective:    Patient ID: Diane Perez, female    DOB: [birth year], 68 y.o.   MRN: [id number]  HPI Diane Perez is a 68 year old married female nonsmoker who comes in today for evaluation of numbness and tingling of her right thigh which radiates down to her foot.  Is a history of diabetes type 2 under well control with metformin 500 mg once daily. Last A1c in April was normal. She's noticed over the last month to 6 weeks and tingling and she points to the right upper thigh as a source of her symptoms. She says it seems to radiate down into her legs. They feel kind of heavy at times and sometimes it feels tingly. Not related to exercise no history of trauma. Weight is steady at 131. No history of back problems urinary tract problems bowel or bladder problems  Social history is pertinent she and her husband are selling her house buying an RV and plan to travel around the country. They're leaving in December   Review of Systems    review of systems otherwise negative Objective:   Physical Exam Well-developed well nourished female no acute distress vital signs stable she's afebrile examination the legs and supine positions shows both legs were of equal length. Light touch , muscle strength, reflexes, monofilament testing all normal. Hips normal ankles normal knee normal abdominal exam normal       Assessment & Plan:  Probable diabetic neuropathy......... check labs........ neuro consult for confirmation

## 2014-08-09 LAB — BASIC METABOLIC PANEL
BUN: 13 mg/dL (ref 6–23)
CALCIUM: 9.7 mg/dL (ref 8.4–10.5)
CO2: 28 mEq/L (ref 19–32)
Chloride: 109 mEq/L (ref 96–112)
Creatinine, Ser: 0.6 mg/dL (ref 0.4–1.2)
GFR: 100.26 mL/min (ref >60.00)
Glucose, Bld: 85 mg/dL (ref 70–99)
Potassium: 4.6 mEq/L (ref 3.5–5.1)
Sodium: 144 mEq/L (ref 135–145)

## 2014-08-09 LAB — CBC WITH DIFFERENTIAL/PLATELET
BASOS PCT: 0.7 % (ref 0.0–3.0)
Basophils Absolute: 0 10*3/uL (ref 0.0–0.1)
Eosinophils Absolute: 0.1 10*3/uL (ref 0.0–0.7)
Eosinophils Relative: 0.8 % (ref 0.0–5.0)
HCT: 39 % (ref 36.0–46.0)
HEMOGLOBIN: 12.9 g/dL (ref 12.0–15.0)
Lymphocytes Relative: 43.2 % (ref 12.0–46.0)
Lymphs Abs: 2.9 10*3/uL (ref 0.7–4.0)
MCHC: 33.2 g/dL (ref 30.0–36.0)
MCV: 92.7 fl (ref 78.0–100.0)
MONO ABS: 0.6 10*3/uL (ref 0.1–1.0)
MONOS PCT: 8.3 % (ref 3.0–12.0)
Neutro Abs: 3.2 10*3/uL (ref 1.4–7.7)
Neutrophils Relative %: 47 % (ref 43.0–77.0)
Platelets: 265 10*3/uL (ref 150.0–400.0)
RBC: 4.2 Mil/uL (ref 3.87–5.11)
RDW: 14 % (ref 11.5–15.5)
WBC: 6.8 10*3/uL (ref 4.0–10.5)

## 2014-08-09 LAB — TSH: TSH: 1.17 u[IU]/mL (ref 0.35–4.50)

## 2014-08-09 LAB — HEMOGLOBIN A1C: Hgb A1c MFr Bld: 6 % (ref 4.6–6.5)

## 2014-09-20 ENCOUNTER — Encounter: Payer: Self-pay | Admitting: Neurology

## 2014-09-20 ENCOUNTER — Ambulatory Visit (INDEPENDENT_AMBULATORY_CARE_PROVIDER_SITE_OTHER): Payer: Medicare Other | Admitting: Neurology

## 2014-09-20 VITALS — BP 100/64 | HR 84 | Ht 62.0 in | Wt 131.1 lb

## 2014-09-20 DIAGNOSIS — R202 Paresthesia of skin: Secondary | ICD-10-CM | POA: Diagnosis not present

## 2014-09-20 DIAGNOSIS — T148 Other injury of unspecified body region: Secondary | ICD-10-CM | POA: Diagnosis not present

## 2014-09-20 DIAGNOSIS — T148XXA Other injury of unspecified body region, initial encounter: Secondary | ICD-10-CM

## 2014-09-20 NOTE — Progress Notes (Signed)
[facility] Clinic Note - Initial Visit   Date: 09/20/2014  Diane Perez MRN: [id number] DOB: Oct 17, 1946   Dear Dr. Sherren Mocha:  Thank you for your kind referral of Diane Perez for consultation of right leg discomfort. Although her history is well known to you, please allow [place] to reiterate it for the purpose of our medical record. The patient was accompanied to the clinic by self.   History of Present Illness: Diane Perez is a delightful 68 y.o. right-handed [place] female with well-controlled diabetes mellitus, hyperlipidemia, and osteoporosis presenting for evaluation of right leg discomfort.   Starting in late 2014, she developed intermittent pressure over the right thigh and lower leg, described as "tired, heavy, tight" sensation.  She has to move the leg around because of discomfort.  No associated numbness/tingling.  Denies any back pain.   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  It is improved with standing and stretching or laying flat.  Denies similar symptoms of the left leg.  She is planning on travelling the [place] with their newly designed RV after selling their current home and is more concerned about symptoms worsening while travelling, so was referred for evaluation.  Out-side paper records, electronic medical record, and images have been reviewed where available and summarized as:  Lab Results  Component Value Date   HGBA1C 6.0 08/08/2014   Lab Results  Component Value Date   TSH 1.17 08/08/2014     Past Medical History  Diagnosis Date  . Diabetes mellitus   . Hyperlipidemia   . Osteoporosis     Past Surgical History  Procedure Laterality Date  . None       Medications:  Current Outpatient Prescriptions on File Prior to Visit  Medication Sig Dispense Refill  . aspirin 81 MG EC tablet Take 81 mg by mouth daily.      . calcium carbonate  200 MG capsule Take 250 mg by mouth 2 (two) times daily with a meal.    . Calcium-Vitamin D (RA CALCIUM PLUS VITAMIN D) 600-125 MG-UNIT TABS Take by mouth daily.      Marland Kitchen glucose blood test strip 1 each by Other route as needed. Use as instructed     . Lancets Misc. MISC by Does not apply route.      Marland Kitchen LORazepam (ATIVAN) 0.5 MG tablet Take 1 tablet (0.5 mg total) by mouth every 8 (eight) hours as needed for anxiety. Take 1 tab po when flying 30 tablet 1  . lovastatin (MEVACOR) 40 MG tablet TAKE 1 TABLET AT BEDTIME 90 tablet 3  . metFORMIN (GLUCOPHAGE-XR) 500 MG 24 hr tablet TAKE 1 TABLET BY MOUTH DAILY WITH BREAKFAST 90 tablet 3  . OVER THE COUNTER MEDICATION vinegar    . traZODone (DESYREL) 50 MG tablet Take 0.5-1 tablets (25-50 mg total) by mouth at bedtime as needed for sleep. 100 tablet 3   No current facility-administered medications on file prior to visit.    Allergies: No Known Allergies  Family History: Family History  Problem Relation Age of Onset  . Diabetes Mother   . Asthma Father     Social History: History   Social History  . Marital Status: Married    Spouse Name: N/A    Number of Children: N/A  . Years of Education: N/A   Occupational History  . Not on file.   Social History Main Topics  .  Smoking status: Former Research scientist (life sciences)  . Smokeless tobacco: Never Used     Comment: 30 years ago  . Alcohol Use: 0.0 oz/week    0 Not specified per week     Comment: 2 glasses of wine/nightly, weekends with occasional bourbon for the past 2-3 years  . Drug Use: No  . Sexual Activity: Not on file   Other Topics Concern  . Not on file   Social History Narrative   Lives with husband in a two story home.  No children.     Self employed.    Education: some college, works as travel Music therapist   Currently trying to sell home and buying motor home to travel throughout the [place].    Review of Systems:  CONSTITUTIONAL: No fevers, chills, night sweats, or weight loss.   EYES: No visual  changes or eye pain ENT: No hearing changes.  No history of nose bleeds.   RESPIRATORY: No cough, wheezing and shortness of breath.   CARDIOVASCULAR: Negative for chest pain, and palpitations.   GI: Negative for abdominal discomfort, blood in stools or black stools.  No recent change in bowel habits.   GU:  No history of incontinence.   MUSCLOSKELETAL: No history of joint pain or swelling.  No myalgias.   SKIN: Negative for lesions, rash, and itching.   HEMATOLOGY/ONCOLOGY: Negative for prolonged bleeding, bruising easily, and swollen nodes.     ENDOCRINE: Negative for cold or heat intolerance, polydipsia or goiter.   PSYCH:  No depression or anxiety symptoms.   NEURO: As Above.   Vital Signs:  BP 100/64 mmHg  Pulse 84  Ht 5\' 2"  (1.575 m)  Wt 131 lb 1 oz (59.45 kg)  BMI 23.97 kg/m2  SpO2 99%   General Medical Exam:   General:  Well appearing, comfortable.   Eyes/ENT: see cranial nerve examination.   Neck: No masses appreciated.  Full range of motion without tenderness.  No carotid bruits. Respiratory:  Clear to auscultation, good air entry bilaterally.   Cardiac:  Regular rate and rhythm, no murmur.   Back:  No pain to palpation of spinous processes.   Extremities:  No deformities, edema, or skin discoloration. Good capillary refill.   Skin:  Skin color, texture, turgor normal. No rashes or lesions.  Neurological Exam: MENTAL STATUS including orientation to time, place, person, recent and remote memory, attention span and concentration, language, and fund of knowledge is normal.  Speech is not dysarthric.  CRANIAL NERVES: II:  No visual field defects.  Unremarkable fundi.   III-IV-VI: Pupils equal round and reactive to light.  Normal conjugate, extra-ocular eye movements in all directions of gaze.  No nystagmus.  No ptosis.   V:  Normal facial sensation.   VII:  Normal facial symmetry and movements.    VIII:  Normal hearing and vestibular function.   IX-X:  Normal palatal  movement.   XI:  Normal shoulder shrug and head rotation.   XII:  Normal tongue strength and range of motion, no deviation or fasciculation.  MOTOR:  No atrophy, fasciculations or abnormal movements.  No pronator drift.  Tone is normal.  Negative straight leg raise on the right, but she reports some improvement of tightness with muscle stretching with external/internal rotation of the hip.  Right Upper Extremity:    Left Upper Extremity:    Deltoid  5/5   Deltoid  5/5   Biceps  5/5   Biceps  5/5   Triceps  5/5   Triceps  5/5   Wrist extensors  5/5   Wrist extensors  5/5   Wrist flexors  5/5   Wrist flexors  5/5   Finger extensors  5/5   Finger extensors  5/5   Finger flexors  5/5   Finger flexors  5/5   Dorsal interossei  5/5   Dorsal interossei  5/5   Abductor pollicis  5/5   Abductor pollicis  5/5   Tone (Ashworth scale)  0  Tone (Ashworth scale)  0   Right Lower Extremity:    Left Lower Extremity:    Hip flexors  5/5   Hip flexors  5/5   Hip extensors  5/5   Hip extensors  5/5   Knee flexors  5/5   Knee flexors  5/5   Knee extensors  5/5   Knee extensors  5/5   Dorsiflexors  5/5   Dorsiflexors  5/5   Plantarflexors  5/5   Plantarflexors  5/5   Toe extensors  5/5   Toe extensors  5/5   Toe flexors  5/5   Toe flexors  5/5   Tone (Ashworth scale)  0  Tone (Ashworth scale)  0   MSRs:  Right                                                                 Left brachioradialis 2+  brachioradialis 2+  biceps 2+  biceps 2+  triceps 2+  triceps 2+  patellar 2+  patellar 2+  ankle jerk 2+  ankle jerk 2+  Hoffman no  Hoffman no  plantar response down  plantar response down   SENSORY:  Normal and symmetric perception of light touch, pinprick, vibration, and proprioception.  Romberg's sign absent.   COORDINATION/GAIT: Normal finger-to- nose-finger and heel-to-shin.  Intact rapid alternating movements bilaterally.  Tandem and stressed gait intact.    IMPRESSION/PLAN: Ms. Mixon is  a very pleasant 68 year-old female presenting for evaluation of right leg discomfort, described as tightness/heaviness.  There is no weakness, changes in reflexes, or sensory deficits on exam, making it difficult to localize her symptoms.  No evidence on her exam to suggest diabetic neuropathy and I praised her for controlled sugars so well.  Since muscle stretching seemed to alleviate some of her discomfort, it is possible that there may be musculoskeletal component for which I recommended physical therapy.  For completeness and to be sure lumbosacral radiculopathy (L4 radiculopathy) is not missed, electrodiagnostic testing of the right leg will be ordered.  Going forward, consider MRI lumbar spine can be done if symptoms do not improve with conservative measures.  Return to clinic in 6 weeks.   The duration of this appointment visit was 45 minutes of face-to-face time with the patient.  Greater than 50% of this time was spent in counseling, explanation of diagnosis, planning of further management, and coordination of care.   Thank you for allowing me to participate in patient's care.  If I can answer any additional questions, I would be pleased to do so.    Sincerely,    Ilisha Blust K. Posey Pronto, DO

## 2014-09-20 NOTE — Patient Instructions (Addendum)
1.  EMG of the right leg 2.  We will send a referral to physical therapy.  If you do not hear from them within a week, please contact my office. 3.  Return to clinic in 6-8 weeks

## 2014-09-30 ENCOUNTER — Encounter: Payer: Self-pay | Admitting: Family Medicine

## 2014-10-10 ENCOUNTER — Telehealth: Payer: Self-pay | Admitting: Neurology

## 2014-10-10 NOTE — Telephone Encounter (Signed)
I called PT and they do have the order.  Patient notified that they are a little backed up in scheduling.

## 2014-10-10 NOTE — Telephone Encounter (Signed)
Please call, pt has never heard from anyone about physical therapy appt. CB# [phone] / Sherri S.

## 2014-10-14 DIAGNOSIS — Z01419 Encounter for gynecological examination (general) (routine) without abnormal findings: Secondary | ICD-10-CM | POA: Diagnosis not present

## 2014-10-14 DIAGNOSIS — Z1231 Encounter for screening mammogram for malignant neoplasm of breast: Secondary | ICD-10-CM | POA: Diagnosis not present

## 2014-10-24 ENCOUNTER — Telehealth: Payer: Self-pay | Admitting: Neurology

## 2014-10-24 NOTE — Telephone Encounter (Signed)
Pt canceled her follow up on 10-27-14 and EMG on 11-07-14 and states that she will call back to resch

## 2014-10-27 ENCOUNTER — Ambulatory Visit: Payer: Medicare Other | Admitting: Neurology

## 2014-11-07 ENCOUNTER — Encounter: Payer: Medicare Other | Admitting: Neurology

## 2014-11-17 ENCOUNTER — Ambulatory Visit: Payer: Medicare Other | Attending: Neurology | Admitting: Physical Therapy

## 2014-11-17 DIAGNOSIS — R202 Paresthesia of skin: Secondary | ICD-10-CM | POA: Insufficient documentation

## 2014-11-17 NOTE — Patient Instructions (Addendum)
HIP / KNEE: Extension - Prone (Weight)   Place ___ lb weight around leg. Squeeze glutes. Raise leg up, keep knee straight. Hold ___ seconds. Use ___ lb weight. ___ reps per set, ___ sets per day, ___ days per week   Copyright  VHI. All rights reserved.  Extension (Prone)   Lie prone, holding a __ pound ball out in front. Lift chest and ball off floor. Repeat __ times. Rest __ seconds after set. Do __ sets per session.  Copyright  VHI. All rights reserved.  (Home) Extension: Caudal Unilateral Hip - Prone   Lean across table on elbows. Lift one leg. Do not lift it higher than trunk. Repeat ____ times per set. Do ____ sets per session. Do ____ sessions per week.  Copyright  VHI. All rights reserved.  Bridging   Slowly raise buttocks from floor, keeping stomach tight. Repeat ____ times per set. Do ____ sets per session. Do ____ sessions per day.  http://orth.exer.us/1097   Copyright  VHI. All rights reserved.  Press-Up   Press upper body upward, keeping hips in contact with floor. Keep lower back and buttocks relaxed. Hold ____ seconds. Repeat ____ times per set. Do ____ sets per session. Do ____ sessions per day.  http://orth.exer.us/94   Copyright  VHI. All rights reserved.  Press-Up   Press upper body upward, keeping hips in contact with floor. Keep lower back and buttocks relaxed. Hold ____ seconds. Repeat ____ times per set. Do ____ sets per session. Do ____ sessions per day.  http://orth.exer.us/94   Copyright  VHI. All rights reserved.  Extension (Prone)   Lie prone, holding a __ pound ball out in front. Lift chest and ball off floor. Repeat __ times. Rest __ seconds after set. Do __ sets per session.  Copyright  VHI. All rights reserved.

## 2014-11-18 ENCOUNTER — Encounter: Payer: Self-pay | Admitting: Physical Therapy

## 2014-11-18 NOTE — Therapy (Signed)
[facility] [address] [place], [place], [address] Phone: [phone]   Fax:  [phone]  Physical Therapy Evaluation  Patient Details  Name: Diane Perez MRN: [id number] Date of Birth: 1946-10-28 Referring Provider:  Alda Berthold, DO  Encounter Date: 11/17/2014      PT End of Session - 12/02/2014 0937    Visit Number 1  G1   Number of Visits 4   Date for PT Re-Evaluation 12/25/14   Authorization Type medicare   Authorization Time Period 11-17-14 - 01-15-15   PT Start Time 0930   PT Stop Time 1015   PT Time Calculation (min) 45 min      Past Medical History  Diagnosis Date  . Diabetes mellitus   . Hyperlipidemia   . Osteoporosis     Past Surgical History  Procedure Laterality Date  . None      There were no vitals taken for this visit.  Visit Diagnosis:  Right leg paresthesias - Plan: PT plan of care cert/re-cert      Subjective Assessment - December 02, 2014 0930    Symptoms Pt. reports RLE discomfort with feeling of heaviness and tightness around top of thigh/groin area which varies in intensity and occurrence "feels like elastic at top of thigh"   Currently in Pain? No/denies      Strength is WNL's except R hip extensors which are 3+/5;  R hip adductors are 4/5  Gait and balance are WNL's with no device used  Pt. Reports discomfort/paresthesias vary in occurrence and in intensity in RLE   Pt. Does Naval architect. Low back flexibility; HEP was initiated following evaluation                 PT Education - Dec 02, 2014 0935    Education provided Yes   Education Details bridging exercises, hip extension prone, lying prone with elbows extended   Person(s) Educated Patient   Methods Explanation;Demonstration;Handout   Comprehension Verbalized understanding;Returned demonstration          PT Short Term Goals - 12-02-2014 1038    PT SHORT TERM GOAL #1   Title same as LTG's           PT Long Term Goals - Dec 02, 2014 1038    PT LONG TERM GOAL #1   Title Report at least 50% improvement in RLE paresthesias/discomfort.   Baseline 12-18-14   Time 4   Period Weeks   Status New   PT LONG TERM GOAL #2   Title Independent in HEP   Baseline 12-18-14   Time 4   Period Weeks   Status New               Plan - 2014-12-02 1036    Pt will benefit from skilled therapeutic intervention in order to improve on the following deficits Impaired sensation;Difficulty walking;Impaired perceived functional ability;Decreased endurance;Decreased mobility;Decreased strength;Impaired flexibility   Rehab Potential Good   PT Frequency 1x / week   PT Duration 4 weeks   PT Treatment/Interventions Therapeutic activities;Patient/family education;Therapeutic exercise;Manual techniques;Neuromuscular re-education;ADLs/Self Care Home Management   PT Next Visit Plan check HEP   PT Home Exercise Plan hip ext. strengthening. low back stretching   Consulted and Agree with Plan of Care Patient          G-Codes - 12-02-14 1039    Functional Assessment Tool Used strength and c/o discomfort in RLE   Functional Limitation Other PT primary   Other PT Primary Current Status (H8527)  At least 40 percent but less than 60 percent impaired, limited or restricted   Other PT Primary Goal Status (M6294) At least 20 percent but less than 40 percent impaired, limited or restricted       Problem List Patient Active Problem List   Diagnosis Date Noted  . Insomnia 02/14/2014  . Viral URI with cough 01/19/2014  . 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 HYPERLIPIDEMIA 07/25/2007  . OSTEOPOROSIS 07/25/2007    Alda Lea, PT 11/18/2014, 10:43 AM  [facility] [address] [place], [place], [address] Phone: [phone]   Fax:  [phone]

## 2014-11-22 ENCOUNTER — Other Ambulatory Visit: Payer: Self-pay | Admitting: Family Medicine

## 2014-12-01 ENCOUNTER — Ambulatory Visit: Payer: Medicare Other | Admitting: Physical Therapy

## 2014-12-19 ENCOUNTER — Encounter: Payer: Self-pay | Admitting: Family Medicine

## 2014-12-20 ENCOUNTER — Other Ambulatory Visit: Payer: Self-pay | Admitting: Family Medicine

## 2014-12-27 ENCOUNTER — Other Ambulatory Visit: Payer: Self-pay | Admitting: *Deleted

## 2014-12-27 DIAGNOSIS — E785 Hyperlipidemia, unspecified: Secondary | ICD-10-CM

## 2014-12-27 MED ORDER — LORAZEPAM 0.5 MG PO TABS: 0.5000 mg | ORAL_TABLET | Freq: Three times a day (TID) | ORAL | Status: DC | PRN

## 2014-12-27 MED ORDER — LOVASTATIN 40 MG PO TABS: ORAL_TABLET | ORAL | Status: DC

## 2014-12-27 MED ORDER — METFORMIN HCL ER 500 MG PO TB24: 500.0000 mg | ORAL_TABLET | Freq: Every day | ORAL | Status: DC

## 2015-03-31 ENCOUNTER — Other Ambulatory Visit: Payer: Self-pay | Admitting: Family Medicine

## 2015-05-01 ENCOUNTER — Other Ambulatory Visit: Payer: Self-pay

## 2016-01-14 ENCOUNTER — Other Ambulatory Visit: Payer: Self-pay | Admitting: Family Medicine

## 2016-02-11 ENCOUNTER — Other Ambulatory Visit: Payer: Self-pay | Admitting: Family Medicine

## 2016-04-10 ENCOUNTER — Ambulatory Visit (INDEPENDENT_AMBULATORY_CARE_PROVIDER_SITE_OTHER): Payer: Medicare Other | Admitting: Adult Health

## 2016-04-10 ENCOUNTER — Encounter: Payer: Self-pay | Admitting: Adult Health

## 2016-04-10 VITALS — BP 120/74 | Temp 98.2°F | Ht 62.0 in | Wt 140.0 lb

## 2016-04-10 DIAGNOSIS — Z7689 Persons encountering health services in other specified circumstances: Secondary | ICD-10-CM

## 2016-04-10 DIAGNOSIS — Z23 Encounter for immunization: Secondary | ICD-10-CM

## 2016-04-10 DIAGNOSIS — E139 Other specified diabetes mellitus without complications: Secondary | ICD-10-CM | POA: Diagnosis not present

## 2016-04-10 DIAGNOSIS — M8588 Other specified disorders of bone density and structure, other site: Secondary | ICD-10-CM | POA: Diagnosis not present

## 2016-04-10 DIAGNOSIS — Z7189 Other specified counseling: Secondary | ICD-10-CM

## 2016-04-10 DIAGNOSIS — Z1231 Encounter for screening mammogram for malignant neoplasm of breast: Secondary | ICD-10-CM | POA: Diagnosis not present

## 2016-04-10 DIAGNOSIS — E785 Hyperlipidemia, unspecified: Secondary | ICD-10-CM | POA: Diagnosis not present

## 2016-04-10 DIAGNOSIS — Z124 Encounter for screening for malignant neoplasm of cervix: Secondary | ICD-10-CM | POA: Diagnosis not present

## 2016-04-10 DIAGNOSIS — N958 Other specified menopausal and perimenopausal disorders: Secondary | ICD-10-CM | POA: Diagnosis not present

## 2016-04-10 DIAGNOSIS — Z6825 Body mass index (BMI) 25.0-25.9, adult: Secondary | ICD-10-CM | POA: Diagnosis not present

## 2016-04-10 NOTE — Patient Instructions (Addendum)
It was great meeting you today!  Please follow up on Tuesday at 7:45 for your next visit.   If you need anything, please let me know   Health Maintenance, Female Adopting a healthy lifestyle and getting preventive care can go a long way to promote health and wellness. Talk with your health care provider about what schedule of regular examinations is right for you. This is a good chance for you to check in with your provider about disease prevention and staying healthy. In between checkups, there are plenty of things you can do on your own. Experts have done a lot of research about which lifestyle changes and preventive measures are most likely to keep you healthy. Ask your health care provider for more information. WEIGHT AND DIET  Eat a healthy diet  Be sure to include plenty of vegetables, fruits, low-fat dairy products, and lean protein.  Do not eat a lot of foods high in solid fats, added sugars, or salt.  Get regular exercise. This is one of the most important things you can do for your health.  Most adults should exercise for at least 150 minutes each week. The exercise should increase your heart rate and make you sweat (moderate-intensity exercise).  Most adults should also do strengthening exercises at least twice a week. This is in addition to the moderate-intensity exercise.  Maintain a healthy weight  Body mass index (BMI) is a measurement that can be used to identify possible weight problems. It estimates body fat based on height and weight. Your health care provider can help determine your BMI and help you achieve or maintain a healthy weight.  For females 41 years of age and older:   A BMI below 18.5 is considered underweight.  A BMI of 18.5 to 24.9 is normal.  A BMI of 25 to 29.9 is considered overweight.  A BMI of 30 and above is considered obese.  Watch levels of cholesterol and blood lipids  You should start having your blood tested for lipids and cholesterol  at 68 years of age, then have this test every 5 years.  You may need to have your cholesterol levels checked more often if:  Your lipid or cholesterol levels are high.  You are older than 68 years of age.  You are at high risk for heart disease.  CANCER SCREENING   Lung Cancer  Lung cancer screening is recommended for adults 53-73 years old who are at high risk for lung cancer because of a history of smoking.  A yearly low-dose CT scan of the lungs is recommended for people who:  Currently smoke.  Have quit within the past 15 years.  Have at least a 30-pack-year history of smoking. A pack year is smoking an average of one pack of cigarettes a day for 1 year.  Yearly screening should continue until it has been 15 years since you quit.  Yearly screening should stop if you develop a health problem that would prevent you from having lung cancer treatment.  Breast Cancer  Practice breast self-awareness. This means understanding how your breasts normally appear and feel.  It also means doing regular breast self-exams. Let your health care provider know about any changes, no matter how small.  If you are in your 20s or 30s, you should have a clinical breast exam (CBE) by a health care provider every 1-3 years as part of a regular health exam.  If you are 103 or older, have a CBE every year. Also  consider having a breast X-ray (mammogram) every year.  If you have a family history of breast cancer, talk to your health care provider about genetic screening.  If you are at high risk for breast cancer, talk to your health care provider about having an MRI and a mammogram every year.  Breast cancer gene (BRCA) assessment is recommended for women who have family members with BRCA-related cancers. BRCA-related cancers include:  Breast.  Ovarian.  Tubal.  Peritoneal cancers.  Results of the assessment will determine the need for genetic counseling and BRCA1 and BRCA2  testing. Cervical Cancer Your health care provider may recommend that you be screened regularly for cancer of the pelvic organs (ovaries, uterus, and vagina). This screening involves a pelvic examination, including checking for microscopic changes to the surface of your cervix (Pap test). You may be encouraged to have this screening done every 3 years, beginning at age 31.  For women ages 67-65, health care providers may recommend pelvic exams and Pap testing every 3 years, or they may recommend the Pap and pelvic exam, combined with testing for human papilloma virus (HPV), every 5 years. Some types of HPV increase your risk of cervical cancer. Testing for HPV may also be done on women of any age with unclear Pap test results.  Other health care providers may not recommend any screening for nonpregnant women who are considered low risk for pelvic cancer and who do not have symptoms. Ask your health care provider if a screening pelvic exam is right for you.  If you have had past treatment for cervical cancer or a condition that could lead to cancer, you need Pap tests and screening for cancer for at least 20 years after your treatment. If Pap tests have been discontinued, your risk factors (such as having a new sexual partner) need to be reassessed to determine if screening should resume. Some women have medical problems that increase the chance of getting cervical cancer. In these cases, your health care provider may recommend more frequent screening and Pap tests. Colorectal Cancer  This type of cancer can be detected and often prevented.  Routine colorectal cancer screening usually begins at 68 years of age and continues through 68 years of age.  Your health care provider may recommend screening at an earlier age if you have risk factors for colon cancer.  Your health care provider may also recommend using home test kits to check for hidden blood in the stool.  A small camera at the end of a  tube can be used to examine your colon directly (sigmoidoscopy or colonoscopy). This is done to check for the earliest forms of colorectal cancer.  Routine screening usually begins at age 25.  Direct examination of the colon should be repeated every 5-10 years through 68 years of age. However, you may need to be screened more often if early forms of precancerous polyps or small growths are found. Skin Cancer  Check your skin from head to toe regularly.  Tell your health care provider about any new moles or changes in moles, especially if there is a change in a mole's shape or color.  Also tell your health care provider if you have a mole that is larger than the size of a pencil eraser.  Always use sunscreen. Apply sunscreen liberally and repeatedly throughout the day.  Protect yourself by wearing long sleeves, pants, a wide-brimmed hat, and sunglasses whenever you are outside. HEART DISEASE, DIABETES, AND HIGH BLOOD PRESSURE   High  blood pressure causes heart disease and increases the risk of stroke. High blood pressure is more likely to develop in:  People who have blood pressure in the high end of the normal range (130-139/85-89 mm Hg).  People who are overweight or obese.  People who are African American.  If you are 30-90 years of age, have your blood pressure checked every 3-5 years. If you are 83 years of age or older, have your blood pressure checked every year. You should have your blood pressure measured twice--once when you are at a hospital or clinic, and once when you are not at a hospital or clinic. Record the average of the two measurements. To check your blood pressure when you are not at a hospital or clinic, you can use:  An automated blood pressure machine at a pharmacy.  A home blood pressure monitor.  If you are between 66 years and 43 years old, ask your health care provider if you should take aspirin to prevent strokes.  Have regular diabetes screenings. This  involves taking a blood sample to check your fasting blood sugar level.  If you are at a normal weight and have a low risk for diabetes, have this test once every three years after 68 years of age.  If you are overweight and have a high risk for diabetes, consider being tested at a younger age or more often. PREVENTING INFECTION  Hepatitis B  If you have a higher risk for hepatitis B, you should be screened for this virus. You are considered at high risk for hepatitis B if:  You were born in a country where hepatitis B is common. Ask your health care provider which countries are considered high risk.  Your parents were born in a high-risk country, and you have not been immunized against hepatitis B (hepatitis B vaccine).  You have HIV or AIDS.  You use needles to inject street drugs.  You live with someone who has hepatitis B.  You have had sex with someone who has hepatitis B.  You get hemodialysis treatment.  You take certain medicines for conditions, including cancer, organ transplantation, and autoimmune conditions. Hepatitis C  Blood testing is recommended for:  Everyone born from [birth year] through 1963.  Anyone with known risk factors for hepatitis C. Sexually transmitted infections (STIs)  You should be screened for sexually transmitted infections (STIs) including gonorrhea and chlamydia if:  You are sexually active and are younger than 68 years of age.  You are older than 68 years of age and your health care provider tells you that you are at risk for this type of infection.  Your sexual activity has changed since you were last screened and you are at an increased risk for chlamydia or gonorrhea. Ask your health care provider if you are at risk.  If you do not have HIV, but are at risk, it may be recommended that you take a prescription medicine daily to prevent HIV infection. This is called pre-exposure prophylaxis (PrEP). You are considered at risk if:  You are  sexually active and do not regularly use condoms or know the HIV status of your partner(s).  You take drugs by injection.  You are sexually active with a partner who has HIV. Talk with your health care provider about whether you are at high risk of being infected with HIV. If you choose to begin PrEP, you should first be tested for HIV. You should then be tested every 3 months for as long  as you are taking PrEP.  PREGNANCY   If you are premenopausal and you may become pregnant, ask your health care provider about preconception counseling.  If you may become pregnant, take 400 to 800 micrograms (mcg) of folic acid every day.  If you want to prevent pregnancy, talk to your health care provider about birth control (contraception). OSTEOPOROSIS AND MENOPAUSE   Osteoporosis is a disease in which the bones lose minerals and strength with aging. This can result in serious bone fractures. Your risk for osteoporosis can be identified using a bone density scan.  If you are 65 years of age or older, or if you are at risk for osteoporosis and fractures, ask your health care provider if you should be screened.  Ask your health care provider whether you should take a calcium or vitamin D supplement to lower your risk for osteoporosis.  Menopause may have certain physical symptoms and risks.  Hormone replacement therapy may reduce some of these symptoms and risks. Talk to your health care provider about whether hormone replacement therapy is right for you.  HOME CARE INSTRUCTIONS   Schedule regular health, dental, and eye exams.  Stay current with your immunizations.   Do not use any tobacco products including cigarettes, chewing tobacco, or electronic cigarettes.  If you are pregnant, do not drink alcohol.  If you are breastfeeding, limit how much and how often you drink alcohol.  Limit alcohol intake to no more than 1 drink per day for nonpregnant women. One drink equals 12 ounces of beer, 5  ounces of wine, or 1 ounces of hard liquor.  Do not use street drugs.  Do not share needles.  Ask your health care provider for help if you need support or information about quitting drugs.  Tell your health care provider if you often feel depressed.  Tell your health care provider if you have ever been abused or do not feel safe at home.   This information is not intended to replace advice given to you by your health care provider. Make sure you discuss any questions you have with your health care provider.   Document Released: 05/06/2011 Document Revised: 11/11/2014 Document Reviewed: 09/22/2013 Elsevier Interactive Patient Education 2016 [employer] Inc[employer]  

## 2016-04-10 NOTE — Progress Notes (Signed)
Patient presents to clinic today to establish care. She is a pleasant caucasian female who  has a past medical history of Diabetes mellitus; Hyperlipidemia; and Osteoporosis.   Acute Concerns: Establish Care   Chronic Issues: Diabetes  - Well controlled on current medications  Hyperlipidemia  - Well controlled on current medications    Health Maintenance: Dental --Twice a year  Vision -- Once a year Immunizations -- UTD  Colonoscopy --2006 - She is due.  Mammogram -- 04/2016  PAP -- No longer needs  Bone Density -- 04/2016   She is followed by  GYN    Past Medical History  Diagnosis Date  . Diabetes mellitus   . Hyperlipidemia   . Osteoporosis     Past Surgical History  Procedure Laterality Date  . None      Current Outpatient Prescriptions on File Prior to Visit  Medication Sig Dispense Refill  . aspirin 81 MG EC tablet Take 81 mg by mouth daily.      . Calcium-Vitamin D (RA CALCIUM PLUS VITAMIN D) 600-125 MG-UNIT TABS Take by mouth daily.      Marland Kitchen glucose blood test strip 1 each by Other route as needed. Use as instructed     . Lancets Misc. MISC by Does not apply route.      Marland Kitchen LORazepam (ATIVAN) 0.5 MG tablet Take 1 tablet (0.5 mg total) by mouth every 8 (eight) hours as needed for anxiety. Take 1 tab po when flying 60 tablet 1  . lovastatin (MEVACOR) 40 MG tablet TAKE 1 TABLET AT BEDTIME 90 tablet 2  . metFORMIN (GLUCOPHAGE-XR) 500 MG 24 hr tablet TAKE 1 TABLET BY MOUTH DAILY WITH BREAKFAST 90 tablet 3  . OVER THE COUNTER MEDICATION vinegar     No current facility-administered medications on file prior to visit.    No Known Allergies  Family History  Problem Relation Age of Onset  . Diabetes Mother   . Asthma Father     Social History   Social History  . Marital Status: Married    Spouse Name: N/A  . Number of Children: N/A  . Years of Education: N/A   Occupational History  . Not on file.   Social History Main Topics  . Smoking status:  Former Research scientist (life sciences)  . Smokeless tobacco: Never Used     Comment: 30 years ago  . Alcohol Use: 0.0 oz/week    0 Standard drinks or equivalent per week     Comment: 2 glasses of wine/nightly, weekends with occasional bourbon for the past 2-3 years  . Drug Use: No  . Sexual Activity: Not on file   Other Topics Concern  . Not on file   Social History Narrative   Lives with husband in a two story home.  No children.     Self employed.    Education: some college, works as travel Music therapist   Currently trying to sell home and buying motor home to travel throughout the [place].    Review of Systems  Constitutional: Negative.   HENT: Negative.   Eyes: Negative.   Respiratory: Negative.   Cardiovascular: Negative.   Gastrointestinal: Negative.   Genitourinary: Negative.   Musculoskeletal: Negative.   Skin: Negative.   Neurological: Negative.   Endo/Heme/Allergies: Negative.   Psychiatric/Behavioral: Negative.   All other systems reviewed and are negative.   BP 120/74 mmHg  Temp(Src) 98.2 F (36.8 C) (Oral)  Ht 5\' 2"  (1.575 m)  Wt 140 lb (63.504 kg)  BMI 25.60 kg/m2  Physical Exam  Constitutional: She is oriented to person, place, and time and well-developed, well-nourished, and in no distress.  HENT:  Head: Normocephalic and atraumatic.  Right Ear: External ear normal.  Left Ear: External ear normal.  Nose: Nose normal.  Mouth/Throat: Oropharynx is clear and moist. No oropharyngeal exudate.  Eyes: Conjunctivae are normal. Pupils are equal, round, and reactive to light.  Neck: Normal range of motion. Neck supple. No JVD present. No tracheal deviation present. No thyromegaly present.  Cardiovascular: Normal rate, regular rhythm, normal heart sounds and intact distal pulses.  Exam reveals no gallop and no friction rub.   No murmur heard. Pulmonary/Chest: Effort normal and breath sounds normal. No stridor. No respiratory distress. She has no wheezes. She has no rales. She exhibits no  tenderness.  Musculoskeletal: Normal range of motion. She exhibits no edema or tenderness.  Lymphadenopathy:    She has no cervical adenopathy.  Neurological: She is alert and oriented to person, place, and time. Gait normal. GCS score is 15.  Skin: Skin is warm and dry. No rash noted. She is not diaphoretic. No erythema. No pallor.  Psychiatric: Mood, memory, affect and judgment normal.  Nursing note and vitals reviewed.   Assessment/Plan:  1. Encounter to establish care - Follow up for MWE  2. Diabetes 1.5, managed as type 2 ([place]) - Well controlled - No change in medication  - Will check labs  3. Hyperlipidemia - Well controlled- no change  - Will check labs at next visit

## 2016-04-16 ENCOUNTER — Ambulatory Visit (INDEPENDENT_AMBULATORY_CARE_PROVIDER_SITE_OTHER): Payer: Medicare Other | Admitting: Adult Health

## 2016-04-16 ENCOUNTER — Encounter: Payer: Self-pay | Admitting: Adult Health

## 2016-04-16 VITALS — BP 108/70 | HR 84 | Temp 98.0°F | Ht 62.0 in | Wt 137.2 lb

## 2016-04-16 DIAGNOSIS — E139 Other specified diabetes mellitus without complications: Secondary | ICD-10-CM

## 2016-04-16 DIAGNOSIS — E785 Hyperlipidemia, unspecified: Secondary | ICD-10-CM

## 2016-04-16 DIAGNOSIS — H02839 Dermatochalasis of unspecified eye, unspecified eyelid: Secondary | ICD-10-CM | POA: Diagnosis not present

## 2016-04-16 DIAGNOSIS — E119 Type 2 diabetes mellitus without complications: Secondary | ICD-10-CM | POA: Diagnosis not present

## 2016-04-16 DIAGNOSIS — H18412 Arcus senilis, left eye: Secondary | ICD-10-CM | POA: Diagnosis not present

## 2016-04-16 DIAGNOSIS — H18411 Arcus senilis, right eye: Secondary | ICD-10-CM | POA: Diagnosis not present

## 2016-04-16 LAB — POC URINALSYSI DIPSTICK (AUTOMATED)
Bilirubin, UA: NEGATIVE
Glucose, UA: NEGATIVE
Ketones, UA: NEGATIVE
LEUKOCYTES UA: NEGATIVE
NITRITE UA: NEGATIVE
PH UA: 6
PROTEIN UA: NEGATIVE
Spec Grav, UA: 1.01
UROBILINOGEN UA: 0.2

## 2016-04-16 LAB — BASIC METABOLIC PANEL
BUN: 18 mg/dL (ref 6–23)
CHLORIDE: 105 meq/L (ref 96–112)
CO2: 29 meq/L (ref 19–32)
CREATININE: 0.79 mg/dL (ref 0.40–1.20)
Calcium: 9.9 mg/dL (ref 8.4–10.5)
GFR: 76.82 mL/min (ref >60.00)
Glucose, Bld: 118 mg/dL — ABNORMAL HIGH (ref 70–99)
POTASSIUM: 4.6 meq/L (ref 3.5–5.1)
SODIUM: 142 meq/L (ref 135–145)

## 2016-04-16 LAB — HEPATIC FUNCTION PANEL
ALT: 16 U/L (ref 0–35)
AST: 16 U/L (ref 0–37)
Albumin: 4.6 g/dL (ref 3.5–5.2)
Alkaline Phosphatase: 47 U/L (ref 39–117)
BILIRUBIN DIRECT: 0.1 mg/dL (ref 0.0–0.3)
BILIRUBIN TOTAL: 0.6 mg/dL (ref 0.2–1.2)
TOTAL PROTEIN: 7.1 g/dL (ref 6.0–8.3)

## 2016-04-16 LAB — CBC WITH DIFFERENTIAL/PLATELET
BASOS ABS: 0 10*3/uL (ref 0.0–0.1)
BASOS PCT: 0.6 % (ref 0.0–3.0)
EOS ABS: 0.2 10*3/uL (ref 0.0–0.7)
Eosinophils Relative: 3.1 % (ref 0.0–5.0)
HEMATOCRIT: 40.8 % (ref 36.0–46.0)
HEMOGLOBIN: 13.7 g/dL (ref 12.0–15.0)
LYMPHS PCT: 43.4 % (ref 12.0–46.0)
Lymphs Abs: 2.2 10*3/uL (ref 0.7–4.0)
MCHC: 33.5 g/dL (ref 30.0–36.0)
MCV: 90.7 fl (ref 78.0–100.0)
MONOS PCT: 11.2 % (ref 3.0–12.0)
Monocytes Absolute: 0.6 10*3/uL (ref 0.1–1.0)
NEUTROS ABS: 2.1 10*3/uL (ref 1.4–7.7)
Neutrophils Relative %: 41.7 % — ABNORMAL LOW (ref 43.0–77.0)
PLATELETS: 306 10*3/uL (ref 150.0–400.0)
RBC: 4.5 Mil/uL (ref 3.87–5.11)
RDW: 14.1 % (ref 11.5–15.5)
WBC: 5.1 10*3/uL (ref 4.0–10.5)

## 2016-04-16 LAB — LIPID PANEL
CHOL/HDL RATIO: 2
Cholesterol: 170 mg/dL (ref 0–200)
HDL: 70.1 mg/dL (ref >39.00)
LDL CALC: 82 mg/dL (ref 0–99)
NonHDL: 99.68
Triglycerides: 88 mg/dL (ref 0.0–149.0)
VLDL: 17.6 mg/dL (ref 0.0–40.0)

## 2016-04-16 LAB — TSH: TSH: 1.91 u[IU]/mL (ref 0.35–4.50)

## 2016-04-16 LAB — HEMOGLOBIN A1C: Hgb A1c MFr Bld: 5.8 % (ref 4.6–6.5)

## 2016-04-16 LAB — HM DIABETES EYE EXAM

## 2016-04-16 MED ORDER — GLUCOSE BLOOD VI STRP: ORAL_STRIP | Status: DC

## 2016-04-16 MED ORDER — LANCETS MISC. MISC: Status: DC

## 2016-04-16 NOTE — Progress Notes (Signed)
Pre visit review using our clinic review tool, if applicable. No additional management support is needed unless otherwise documented below in the visit note. 

## 2016-04-16 NOTE — Progress Notes (Addendum)
Subjective:    Patient ID: Diane Perez, female    DOB: 1946/10/28, 70 y.o.   MRN: [id number]  HPI  70 year old female who  has a past medical history of Diabetes mellitus; Hyperlipidemia; and Osteoporosis. Presents to the office today for follow up regarding DM and hyperlipidemia.   She and her husband are leaving to travel the [place] in their motor home this week.   Diabetes is well undercontrol with 500mg  Metformin once daily. Her last A1c was 6.0 in 2015.   She takes Ativan when necessary for traveling and flying.  She's had a bone density in the past which is normal she takes calcium daily and walks daily   She gets routine eye care, dental care, BSE monthly. She will have a Dexa screen and mammogram done prior to leaving.    She is due for her colonoscopy.    Cognitive function normal she walks on a regular basis home health safety reviewed no issues identified, no guns in the house, she does have a health care power of attorney and living well  Review of Systems  Constitutional: Negative.   HENT: Negative.   Eyes: Negative.   Respiratory: Negative.   Cardiovascular: Negative.   Gastrointestinal: Negative.   Endocrine: Negative.   Genitourinary: Negative.   Musculoskeletal: Negative.   Skin: Negative.   Allergic/Immunologic: Negative.   Neurological: Negative.   Hematological: Negative.   Psychiatric/Behavioral: Negative.   All other systems reviewed and are negative.  Past Medical History  Diagnosis Date  . Diabetes mellitus   . Hyperlipidemia   . Osteoporosis     Social History   Social History  . Marital Status: Married    Spouse Name: N/A  . Number of Children: N/A  . Years of Education: N/A   Occupational History  . Not on file.   Social History Main Topics  . Smoking status: Former Research scientist (life sciences)  . Smokeless tobacco: Never Used     Comment: 30 years ago  . Alcohol Use: 0.0 oz/week    0 Standard drinks or equivalent per week   Comment: 2 glasses of wine/nightly, weekends with occasional bourbon for the past 2-3 years  . Drug Use: No  . Sexual Activity: Not on file   Other Topics Concern  . Not on file   Social History Narrative   Lives with husband in a two story home.  No children.     Self employed.    Education: some college, works as travel Music therapist   Currently trying to sell home and buying motor home to travel throughout the [place].    Past Surgical History  Procedure Laterality Date  . None      Family History  Problem Relation Age of Onset  . Diabetes Mother   . Asthma Father     No Known Allergies  Current Outpatient Prescriptions on File Prior to Visit  Medication Sig Dispense Refill  . aspirin 81 MG EC tablet Take 81 mg by mouth daily.      . Calcium-Vitamin D (RA CALCIUM PLUS VITAMIN D) 600-125 MG-UNIT TABS Take by mouth daily.      Marland Kitchen glucose blood test strip 1 each by Other route as needed. Use as instructed     . Lancets Misc. MISC by Does not apply route.      Marland Kitchen LORazepam (ATIVAN) 0.5 MG tablet Take 1 tablet (0.5 mg total) by mouth every 8 (eight) hours as needed for anxiety. Take 1 tab  po when flying 60 tablet 1  . lovastatin (MEVACOR) 40 MG tablet TAKE 1 TABLET AT BEDTIME 90 tablet 2  . metFORMIN (GLUCOPHAGE-XR) 500 MG 24 hr tablet TAKE 1 TABLET BY MOUTH DAILY WITH BREAKFAST 90 tablet 3  . OVER THE COUNTER MEDICATION vinegar     No current facility-administered medications on file prior to visit.    There were no vitals taken for this visit.       Objective:   Physical Exam  Constitutional: She is oriented to person, place, and time. She appears well-developed and well-nourished. No distress.  HENT:  Head: Normocephalic and atraumatic.  Right Ear: External ear normal.  Left Ear: External ear normal.  Nose: Nose normal.  Mouth/Throat: Oropharynx is clear and moist. No oropharyngeal exudate.  Eyes: Conjunctivae are normal. Pupils are equal, round, and reactive to light. Right  eye exhibits no discharge. Left eye exhibits no discharge. No scleral icterus.  Neck: Normal range of motion. Neck supple. No tracheal deviation present. No thyromegaly present.  Cardiovascular: Normal rate, regular rhythm, normal heart sounds and intact distal pulses.  Exam reveals no gallop and no friction rub.   No murmur heard. Pulmonary/Chest: Effort normal and breath sounds normal. No respiratory distress. She has no wheezes. She has no rales. She exhibits no tenderness.  Abdominal: Soft. Bowel sounds are normal. She exhibits no distension and no mass. There is no tenderness. There is no rebound and no guarding.  Genitourinary:  Breast Exam: No masses, lumps, dimpling or discharge.   Musculoskeletal: Normal range of motion.  Lymphadenopathy:    She has no cervical adenopathy.  Neurological: She is alert and oriented to person, place, and time. No cranial nerve deficit. Coordination normal.  Skin: Skin is warm and dry. No rash noted. No erythema. No pallor.  Psychiatric: She has a normal mood and affect. Her behavior is normal. Judgment and thought content normal.  Nursing note and vitals reviewed.     Assessment & Plan:  1. Diabetes 1.5, managed as type 2 ([place])  - POCT Urinalysis Dipstick (Automated) - Basic metabolic panel - CBC with Differential/Platelet - EKG 12-Lead- NSR, Rate 81  - Hemoglobin A1c - Hepatic function panel - Lipid panel - TSH - glucose blood test strip; Use as instructed  Dispense: 100 each; Refill: 12 - Lancets Misc. MISC; Use as directed  Dispense: 100 each; Refill: 6 - Consider changing medication  2. Hyperlipidemia  - POCT Urinalysis Dipstick (Automated) - Basic metabolic panel - CBC with Differential/Platelet - EKG 12-Lead - Hemoglobin A1c - Hepatic function panel - Lipid panel - TSH - Well controlled. Consider changing medication    Dorothyann Peng, NP

## 2016-04-16 NOTE — Patient Instructions (Addendum)
It was great seeing you today!  I will follow up with you regarding your labs.   Have a great time on your trip and I wish you safe travels.   Please let me know if there is anything you needed  Health Maintenance, Female Adopting a healthy lifestyle and getting preventive care can go a long way to promote health and wellness. Talk with your health care provider about what schedule of regular examinations is right for you. This is a good chance for you to check in with your provider about disease prevention and staying healthy. In between checkups, there are plenty of things you can do on your own. Experts have done a lot of research about which lifestyle changes and preventive measures are most likely to keep you healthy. Ask your health care provider for more information. WEIGHT AND DIET  Eat a healthy diet  Be sure to include plenty of vegetables, fruits, low-fat dairy products, and lean protein.  Do not eat a lot of foods high in solid fats, added sugars, or salt.  Get regular exercise. This is one of the most important things you can do for your health.  Most adults should exercise for at least 150 minutes each week. The exercise should increase your heart rate and make you sweat (moderate-intensity exercise).  Most adults should also do strengthening exercises at least twice a week. This is in addition to the moderate-intensity exercise.  Maintain a healthy weight  Body mass index (BMI) is a measurement that can be used to identify possible weight problems. It estimates body fat based on height and weight. Your health care provider can help determine your BMI and help you achieve or maintain a healthy weight.  For females 37 years of age and older:   A BMI below 18.5 is considered underweight.  A BMI of 18.5 to 24.9 is normal.  A BMI of 25 to 29.9 is considered overweight.  A BMI of 30 and above is considered obese.  Watch levels of cholesterol and blood lipids  You  should start having your blood tested for lipids and cholesterol at 68 years of age, then have this test every 5 years.  You may need to have your cholesterol levels checked more often if:  Your lipid or cholesterol levels are high.  You are older than 68 years of age.  You are at high risk for heart disease.  CANCER SCREENING   Lung Cancer  Lung cancer screening is recommended for adults 65-72 years old who are at high risk for lung cancer because of a history of smoking.  A yearly low-dose CT scan of the lungs is recommended for people who:  Currently smoke.  Have quit within the past 15 years.  Have at least a 30-pack-year history of smoking. A pack year is smoking an average of one pack of cigarettes a day for 1 year.  Yearly screening should continue until it has been 15 years since you quit.  Yearly screening should stop if you develop a health problem that would prevent you from having lung cancer treatment.  Breast Cancer  Practice breast self-awareness. This means understanding how your breasts normally appear and feel.  It also means doing regular breast self-exams. Let your health care provider know about any changes, no matter how small.  If you are in your 20s or 30s, you should have a clinical breast exam (CBE) by a health care provider every 1-3 years as part of a regular health  exam.  If you are 40 or older, have a CBE every year. Also consider having a breast X-ray (mammogram) every year.  If you have a family history of breast cancer, talk to your health care provider about genetic screening.  If you are at high risk for breast cancer, talk to your health care provider about having an MRI and a mammogram every year.  Breast cancer gene (BRCA) assessment is recommended for women who have family members with BRCA-related cancers. BRCA-related cancers include:  Breast.  Ovarian.  Tubal.  Peritoneal cancers.  Results of the assessment will determine  the need for genetic counseling and BRCA1 and BRCA2 testing. Cervical Cancer Your health care provider may recommend that you be screened regularly for cancer of the pelvic organs (ovaries, uterus, and vagina). This screening involves a pelvic examination, including checking for microscopic changes to the surface of your cervix (Pap test). You may be encouraged to have this screening done every 3 years, beginning at age 61.  For women ages 10-65, health care providers may recommend pelvic exams and Pap testing every 3 years, or they may recommend the Pap and pelvic exam, combined with testing for human papilloma virus (HPV), every 5 years. Some types of HPV increase your risk of cervical cancer. Testing for HPV may also be done on women of any age with unclear Pap test results.  Other health care providers may not recommend any screening for nonpregnant women who are considered low risk for pelvic cancer and who do not have symptoms. Ask your health care provider if a screening pelvic exam is right for you.  If you have had past treatment for cervical cancer or a condition that could lead to cancer, you need Pap tests and screening for cancer for at least 20 years after your treatment. If Pap tests have been discontinued, your risk factors (such as having a new sexual partner) need to be reassessed to determine if screening should resume. Some women have medical problems that increase the chance of getting cervical cancer. In these cases, your health care provider may recommend more frequent screening and Pap tests. Colorectal Cancer  This type of cancer can be detected and often prevented.  Routine colorectal cancer screening usually begins at 68 years of age and continues through 68 years of age.  Your health care provider may recommend screening at an earlier age if you have risk factors for colon cancer.  Your health care provider may also recommend using home test kits to check for hidden blood  in the stool.  A small camera at the end of a tube can be used to examine your colon directly (sigmoidoscopy or colonoscopy). This is done to check for the earliest forms of colorectal cancer.  Routine screening usually begins at age 54.  Direct examination of the colon should be repeated every 5-10 years through 68 years of age. However, you may need to be screened more often if early forms of precancerous polyps or small growths are found. Skin Cancer  Check your skin from head to toe regularly.  Tell your health care provider about any new moles or changes in moles, especially if there is a change in a mole's shape or color.  Also tell your health care provider if you have a mole that is larger than the size of a pencil eraser.  Always use sunscreen. Apply sunscreen liberally and repeatedly throughout the day.  Protect yourself by wearing long sleeves, pants, a wide-brimmed hat, and sunglasses  whenever you are outside. HEART DISEASE, DIABETES, AND HIGH BLOOD PRESSURE   High blood pressure causes heart disease and increases the risk of stroke. High blood pressure is more likely to develop in:  People who have blood pressure in the high end of the normal range (130-139/85-89 mm Hg).  People who are overweight or obese.  People who are African American.  If you are 58-2 years of age, have your blood pressure checked every 3-5 years. If you are 80 years of age or older, have your blood pressure checked every year. You should have your blood pressure measured twice--once when you are at a hospital or clinic, and once when you are not at a hospital or clinic. Record the average of the two measurements. To check your blood pressure when you are not at a hospital or clinic, you can use:  An automated blood pressure machine at a pharmacy.  A home blood pressure monitor.  If you are between 55 years and 55 years old, ask your health care provider if you should take aspirin to prevent  strokes.  Have regular diabetes screenings. This involves taking a blood sample to check your fasting blood sugar level.  If you are at a normal weight and have a low risk for diabetes, have this test once every three years after 68 years of age.  If you are overweight and have a high risk for diabetes, consider being tested at a younger age or more often. PREVENTING INFECTION  Hepatitis B  If you have a higher risk for hepatitis B, you should be screened for this virus. You are considered at high risk for hepatitis B if:  You were born in a country where hepatitis B is common. Ask your health care provider which countries are considered high risk.  Your parents were born in a high-risk country, and you have not been immunized against hepatitis B (hepatitis B vaccine).  You have HIV or AIDS.  You use needles to inject street drugs.  You live with someone who has hepatitis B.  You have had sex with someone who has hepatitis B.  You get hemodialysis treatment.  You take certain medicines for conditions, including cancer, organ transplantation, and autoimmune conditions. Hepatitis C  Blood testing is recommended for:  Everyone born from [birth year] through 1963.  Anyone with known risk factors for hepatitis C. Sexually transmitted infections (STIs)  You should be screened for sexually transmitted infections (STIs) including gonorrhea and chlamydia if:  You are sexually active and are younger than 68 years of age.  You are older than 68 years of age and your health care provider tells you that you are at risk for this type of infection.  Your sexual activity has changed since you were last screened and you are at an increased risk for chlamydia or gonorrhea. Ask your health care provider if you are at risk.  If you do not have HIV, but are at risk, it may be recommended that you take a prescription medicine daily to prevent HIV infection. This is called pre-exposure prophylaxis  (PrEP). You are considered at risk if:  You are sexually active and do not regularly use condoms or know the HIV status of your partner(s).  You take drugs by injection.  You are sexually active with a partner who has HIV. Talk with your health care provider about whether you are at high risk of being infected with HIV. If you choose to begin PrEP, you should first be  tested for HIV. You should then be tested every 3 months for as long as you are taking PrEP.  PREGNANCY   If you are premenopausal and you may become pregnant, ask your health care provider about preconception counseling.  If you may become pregnant, take 400 to 800 micrograms (mcg) of folic acid every day.  If you want to prevent pregnancy, talk to your health care provider about birth control (contraception). OSTEOPOROSIS AND MENOPAUSE   Osteoporosis is a disease in which the bones lose minerals and strength with aging. This can result in serious bone fractures. Your risk for osteoporosis can be identified using a bone density scan.  If you are 32 years of age or older, or if you are at risk for osteoporosis and fractures, ask your health care provider if you should be screened.  Ask your health care provider whether you should take a calcium or vitamin D supplement to lower your risk for osteoporosis.  Menopause may have certain physical symptoms and risks.  Hormone replacement therapy may reduce some of these symptoms and risks. Talk to your health care provider about whether hormone replacement therapy is right for you.  HOME CARE INSTRUCTIONS   Schedule regular health, dental, and eye exams.  Stay current with your immunizations.   Do not use any tobacco products including cigarettes, chewing tobacco, or electronic cigarettes.  If you are pregnant, do not drink alcohol.  If you are breastfeeding, limit how much and how often you drink alcohol.  Limit alcohol intake to no more than 1 drink per day for  nonpregnant women. One drink equals 12 ounces of beer, 5 ounces of wine, or 1 ounces of hard liquor.  Do not use street drugs.  Do not share needles.  Ask your health care provider for help if you need support or information about quitting drugs.  Tell your health care provider if you often feel depressed.  Tell your health care provider if you have ever been abused or do not feel safe at home.   This information is not intended to replace advice given to you by your health care provider. Make sure you discuss any questions you have with your health care provider.   Document Released: 05/06/2011 Document Revised: 11/11/2014 Document Reviewed: 09/22/2013 Elsevier Interactive Patient Education [employer].

## 2016-04-16 NOTE — Addendum Note (Signed)
Addended by: Apolinar Junes on: 04/16/2016 08:10 AM   Modules accepted: Orders, Medications

## 2016-04-18 DIAGNOSIS — K640 First degree hemorrhoids: Secondary | ICD-10-CM | POA: Diagnosis not present

## 2016-04-18 DIAGNOSIS — K635 Polyp of colon: Secondary | ICD-10-CM | POA: Diagnosis not present

## 2016-04-18 DIAGNOSIS — D120 Benign neoplasm of cecum: Secondary | ICD-10-CM | POA: Diagnosis not present

## 2016-04-18 DIAGNOSIS — Z1211 Encounter for screening for malignant neoplasm of colon: Secondary | ICD-10-CM | POA: Diagnosis not present

## 2016-04-18 DIAGNOSIS — D124 Benign neoplasm of descending colon: Secondary | ICD-10-CM | POA: Diagnosis not present

## 2016-04-18 DIAGNOSIS — D123 Benign neoplasm of transverse colon: Secondary | ICD-10-CM | POA: Diagnosis not present

## 2016-04-18 DIAGNOSIS — K5730 Diverticulosis of large intestine without perforation or abscess without bleeding: Secondary | ICD-10-CM | POA: Diagnosis not present

## 2016-10-01 ENCOUNTER — Other Ambulatory Visit: Payer: Self-pay | Admitting: Adult Health

## 2016-10-01 ENCOUNTER — Encounter: Payer: Self-pay | Admitting: Family Medicine

## 2016-10-01 DIAGNOSIS — E1310 Other specified diabetes mellitus with ketoacidosis without coma: Secondary | ICD-10-CM

## 2016-10-01 MED ORDER — GLUCOSE BLOOD VI STRP: ORAL_STRIP | 5 refills | Status: DC

## 2016-10-01 NOTE — Telephone Encounter (Signed)
Diane Perez is now PCP.

## 2016-10-02 ENCOUNTER — Other Ambulatory Visit: Payer: Self-pay

## 2016-10-02 ENCOUNTER — Telehealth: Payer: Self-pay

## 2016-10-02 DIAGNOSIS — E139 Other specified diabetes mellitus without complications: Secondary | ICD-10-CM

## 2016-10-02 MED ORDER — GLUCOSE BLOOD VI STRP: ORAL_STRIP | 6 refills | Status: DC

## 2016-10-02 MED ORDER — LANCETS MISC. MISC: 6 refills | Status: DC

## 2016-10-02 NOTE — Telephone Encounter (Signed)
Call to schedule AWV The patient states she is in [place] and traveling across the [place]; needs refill for lancets and scripts and completed.  Will schedule AWV and CPE; together in the spring when they are back in [place]

## 2016-10-03 MED ORDER — METFORMIN HCL ER 500 MG PO TB24: ORAL_TABLET | ORAL | 3 refills | Status: DC

## 2016-10-04 ENCOUNTER — Other Ambulatory Visit: Payer: Self-pay

## 2016-10-04 MED ORDER — GLUCOSE BLOOD VI STRP: ORAL_STRIP | 6 refills | Status: DC

## 2016-10-04 MED ORDER — FREESTYLE LANCETS MISC: 6 refills | Status: DC

## 2016-10-04 MED ORDER — FREESTYLE LITE DEVI: 0 refills | Status: DC

## 2016-10-07 ENCOUNTER — Other Ambulatory Visit: Payer: Self-pay

## 2016-10-07 MED ORDER — FREESTYLE LITE DEVI: 0 refills | Status: DC

## 2016-10-07 MED ORDER — FREESTYLE LANCETS MISC: 6 refills | Status: DC

## 2016-10-07 MED ORDER — GLUCOSE BLOOD VI STRP: ORAL_STRIP | 6 refills | Status: DC

## 2016-10-27 ENCOUNTER — Encounter: Payer: Self-pay | Admitting: Family Medicine

## 2016-10-29 ENCOUNTER — Encounter: Payer: Self-pay | Admitting: Adult Health

## 2016-11-01 ENCOUNTER — Encounter: Payer: Self-pay | Admitting: Adult Health

## 2016-11-04 DIAGNOSIS — C719 Malignant neoplasm of brain, unspecified: Secondary | ICD-10-CM

## 2016-11-04 HISTORY — DX: Malignant neoplasm of brain, unspecified: C71.9

## 2016-11-05 ENCOUNTER — Other Ambulatory Visit: Payer: Self-pay

## 2016-11-05 MED ORDER — FREESTYLE LITE DEVI: 0 refills | Status: DC

## 2016-11-08 ENCOUNTER — Other Ambulatory Visit: Payer: Self-pay | Admitting: Family Medicine

## 2017-04-01 ENCOUNTER — Telehealth: Payer: Self-pay | Admitting: Family Medicine

## 2017-04-01 NOTE — Telephone Encounter (Signed)
Pt and her husband will be back in town 07/14/2017 and would prefer to see Dr Sherren Mocha.  Pt travels and comes back once a yr for a visit. State she had no choice last year but to see [facility]. Prefers to see dr Lindajo Royal to schedule with Dr Sherren Mocha?  Pt states they have seen dr Sherren Mocha for years.

## 2017-04-02 NOTE — Telephone Encounter (Signed)
Dr. Sherren Mocha has denied this request.  Please schedule with [facility].

## 2017-04-02 NOTE — Telephone Encounter (Signed)
Patients has been sch with cory

## 2017-04-07 ENCOUNTER — Encounter: Payer: Self-pay | Admitting: Adult Health

## 2017-04-07 ENCOUNTER — Other Ambulatory Visit: Payer: Self-pay

## 2017-04-07 MED ORDER — LOVASTATIN 40 MG PO TABS: 40.0000 mg | ORAL_TABLET | Freq: Every day | ORAL | 2 refills | Status: DC

## 2017-07-14 NOTE — Progress Notes (Signed)
Subjective:    Patient ID: Diane Perez, female    DOB: 21-Feb-1946, 71 y.o.   MRN: [id number]  HPI  Patient presents for yearly follow up exam. She is a pleasant 71 year old female who  has a past medical history of Diabetes mellitus; Hyperlipidemia; and Osteoporosis.  She currently takes:   Metformin 500 mg XR for contolled of diabetes. Her last A1c was 5.8 > than one year ago. In the office she reports that her blood sugars have been well contolled at home   Lovastatin 40 mg and ASA 8 1mg  daily for hyperlipidemia.   All immunizations and health maintenance protocols were reviewed with the patient and needed orders were placed.  Appropriate screening laboratory values were ordered for the patient including screening of hyperlipidemia, renal function and hepatic function.  Medication reconciliation,  past medical history, social history, problem list and allergies were reviewed in detail with the patient  Goals were established with regard to weight loss, exercise, and  diet in compliance with medications  End of life planning was discussed. She has an advanced directive and living will.   Her last colonoscopy was in 2017. Marland Kitchen She has been seen by GYN. She participates in routine dental and vision screens. She had a bone density screen done in June 2017. She had her mammogram done in June 2017 - which was normal   Her  acute complaints is that of a dry cough x 3-4 months. She has been using cough drops which help. Cough happens more often during the day. She denies any symptoms of GERD  She also complains of a feeling of rapid heart beat at night. She denies any pain or shortness of breath. This feeling lasts about 2-3 minutes and then resolves.   Review of Systems  Constitutional: Negative.   HENT: Negative.   Eyes: Negative.   Respiratory: Positive for cough.   Cardiovascular: Positive for palpitations.  Gastrointestinal: Negative.   Endocrine: Negative.     Genitourinary: Negative.   Musculoskeletal: Negative.   Skin: Negative.   Allergic/Immunologic: Negative.   Neurological: Negative.   Hematological: Negative.   Psychiatric/Behavioral: Negative.   All other systems reviewed and are negative.  Past Medical History:  Diagnosis Date  . Diabetes mellitus   . Hyperlipidemia   . Osteoporosis     Social History   Social History  . Marital status: Married    Spouse name: N/A  . Number of children: N/A  . Years of education: N/A   Occupational History  . Not on file.   Social History Main Topics  . Smoking status: Former Research scientist (life sciences)  . Smokeless tobacco: Never Used     Comment: 30 years ago  . Alcohol use 0.0 oz/week     Comment: 2 glasses of wine/nightly, weekends with occasional bourbon for the past 2-3 years  . Drug use: No  . Sexual activity: Not on file   Other Topics Concern  . Not on file   Social History Narrative   Lives with husband in a two story home.  No children.     Self employed.    Education: some college, works as travel Music therapist   Currently trying to sell home and buying motor home to travel throughout the [place].    Past Surgical History:  Procedure Laterality Date  . none      Family History  Problem Relation Age of Onset  . Diabetes Mother   . Asthma Father  No Known Allergies  Current Outpatient Prescriptions on File Prior to Visit  Medication Sig Dispense Refill  . aspirin 81 MG EC tablet Take 81 mg by mouth daily.      . Blood Glucose Monitoring Suppl (FREESTYLE LITE) DEVI Use as directed 1 each 0  . Blood Glucose Monitoring Suppl (FREESTYLE LITE) DEVI Test blood sugars daily. 1 each 0  . Calcium-Vitamin D (RA CALCIUM PLUS VITAMIN D) 600-125 MG-UNIT TABS Take by mouth daily.      Marland Kitchen glucose blood (FREESTYLE LITE) test strip Use as instructed 100 each 6  . glucose blood test strip Use as directed 100 each 6  . Lancets (FREESTYLE) lancets Use as instructed 100 each 6  . Lancets Misc. MISC Use  as directed 100 each 6  . LORazepam (ATIVAN) 0.5 MG tablet Take 1 tablet (0.5 mg total) by mouth every 8 (eight) hours as needed for anxiety. Take 1 tab po when flying 60 tablet 1  . lovastatin (MEVACOR) 40 MG tablet Take 1 tablet (40 mg total) by mouth at bedtime. 90 tablet 2  . metFORMIN (GLUCOPHAGE-XR) 500 MG 24 hr tablet TAKE 1 TABLET BY MOUTH DAILY WITH BREAKFAST 90 tablet 3  . OVER THE COUNTER MEDICATION vinegar     No current facility-administered medications on file prior to visit.     BP 114/82   Pulse 94   Temp 97.8 F (36.6 C)   Resp 18   Ht 5\' 2"  (1.575 m)   Wt 139 lb (63 kg)   SpO2 96%   BMI 25.42 kg/m       Objective:   Physical Exam  Constitutional: She is oriented to person, place, and time. She appears well-developed and well-nourished. No distress.  HENT:  Head: Normocephalic and atraumatic.  Right Ear: External ear normal.  Left Ear: External ear normal.  Nose: Nose normal.  Mouth/Throat: Oropharynx is clear and moist. No oropharyngeal exudate.  Eyes: Pupils are equal, round, and reactive to light. Conjunctivae and EOM are normal. Right eye exhibits no discharge. Left eye exhibits no discharge. No scleral icterus.  Neck: Normal range of motion. Neck supple. No JVD present. No tracheal tenderness present. Carotid bruit is not present. No tracheal deviation present. No thyroid mass and no thyromegaly present.  Cardiovascular: Normal rate, regular rhythm, normal heart sounds and intact distal pulses.  Exam reveals no gallop and no friction rub.   No murmur heard. Pulmonary/Chest: Effort normal and breath sounds normal. No stridor. No respiratory distress. She has no wheezes. She has no rales. She exhibits no tenderness.  Abdominal: Soft. Bowel sounds are normal. She exhibits no distension and no mass. There is no tenderness. There is no rebound and no guarding.  Genitourinary:  Genitourinary Comments: Done by GYN. She refused breast exam today   Musculoskeletal:  Normal range of motion. She exhibits no edema, tenderness or deformity.  Lymphadenopathy:    She has no cervical adenopathy.  Neurological: She is alert and oriented to person, place, and time. She has normal reflexes. She displays normal reflexes. No cranial nerve deficit. She exhibits normal muscle tone. Coordination normal.  Skin: Skin is warm and dry. No rash noted. She is not diaphoretic. No erythema. No pallor.  Psychiatric: She has a normal mood and affect. Her behavior is normal. Judgment and thought content normal.  Nursing note and vitals reviewed.     Assessment & Plan:  1. Mixed hyperlipidemia - Consider changing dose of lovastatin  - Basic metabolic panel -  CBC with Differential/Platelet - Hemoglobin A1c - Hepatic function panel - Lipid panel - TSH  2. Diabetes 1.5, managed as type 2 ([place]) - Consider increasing medication - Basic metabolic panel - CBC with Differential/Platelet - Hemoglobin A1c - Hepatic function panel - Lipid panel - TSH  3. Tachycardia  - EKG 12-Lead- NSR, rate 92 - I recommended cardiology consult but she refuses at this time   4. Dry cough  - DG Chest 2 View; Future  5. Need for hepatitis C screening test - Hep C Antibody   Dorothyann Peng, NP

## 2017-07-15 ENCOUNTER — Ambulatory Visit (INDEPENDENT_AMBULATORY_CARE_PROVIDER_SITE_OTHER)
Admission: RE | Admit: 2017-07-15 | Discharge: 2017-07-15 | Disposition: A | Discharge status: Home / Self Care | Payer: Medicare Other | Source: Ambulatory Visit | Attending: Adult Health | Admitting: Adult Health

## 2017-07-15 ENCOUNTER — Ambulatory Visit (INDEPENDENT_AMBULATORY_CARE_PROVIDER_SITE_OTHER): Payer: Medicare Other | Admitting: Adult Health

## 2017-07-15 VITALS — BP 114/82 | HR 94 | Temp 97.8°F | Resp 18 | Ht 62.0 in | Wt 139.0 lb

## 2017-07-15 DIAGNOSIS — R058 Other specified cough: Secondary | ICD-10-CM

## 2017-07-15 DIAGNOSIS — E109 Type 1 diabetes mellitus without complications: Secondary | ICD-10-CM | POA: Diagnosis not present

## 2017-07-15 DIAGNOSIS — R000 Tachycardia, unspecified: Secondary | ICD-10-CM | POA: Diagnosis not present

## 2017-07-15 DIAGNOSIS — E782 Mixed hyperlipidemia: Secondary | ICD-10-CM

## 2017-07-15 DIAGNOSIS — Z1159 Encounter for screening for other viral diseases: Secondary | ICD-10-CM | POA: Diagnosis not present

## 2017-07-15 DIAGNOSIS — E139 Other specified diabetes mellitus without complications: Secondary | ICD-10-CM

## 2017-07-15 DIAGNOSIS — R05 Cough: Secondary | ICD-10-CM | POA: Diagnosis not present

## 2017-07-15 LAB — CBC WITH DIFFERENTIAL/PLATELET
BASOS PCT: 0.5 % (ref 0.0–3.0)
Basophils Absolute: 0 10*3/uL (ref 0.0–0.1)
EOS PCT: 0.7 % (ref 0.0–5.0)
Eosinophils Absolute: 0 10*3/uL (ref 0.0–0.7)
HEMATOCRIT: 40.3 % (ref 36.0–46.0)
Hemoglobin: 13.2 g/dL (ref 12.0–15.0)
LYMPHS ABS: 1.8 10*3/uL (ref 0.7–4.0)
Lymphocytes Relative: 27.5 % (ref 12.0–46.0)
MCHC: 32.9 g/dL (ref 30.0–36.0)
MCV: 94.8 fl (ref 78.0–100.0)
MONOS PCT: 7.7 % (ref 3.0–12.0)
Monocytes Absolute: 0.5 10*3/uL (ref 0.1–1.0)
NEUTROS ABS: 4.2 10*3/uL (ref 1.4–7.7)
NEUTROS PCT: 63.6 % (ref 43.0–77.0)
PLATELETS: 335 10*3/uL (ref 150.0–400.0)
RBC: 4.25 Mil/uL (ref 3.87–5.11)
RDW: 13.7 % (ref 11.5–15.5)
WBC: 6.7 10*3/uL (ref 4.0–10.5)

## 2017-07-15 LAB — BASIC METABOLIC PANEL
BUN: 21 mg/dL (ref 6–23)
CHLORIDE: 106 meq/L (ref 96–112)
CO2: 28 meq/L (ref 19–32)
Calcium: 9.5 mg/dL (ref 8.4–10.5)
Creatinine, Ser: 0.72 mg/dL (ref 0.40–1.20)
GFR: 85.19 mL/min (ref >60.00)
GLUCOSE: 126 mg/dL — AB (ref 70–99)
POTASSIUM: 4 meq/L (ref 3.5–5.1)
SODIUM: 144 meq/L (ref 135–145)

## 2017-07-15 LAB — LIPID PANEL
CHOL/HDL RATIO: 3
Cholesterol: 167 mg/dL (ref 0–200)
HDL: 63.9 mg/dL (ref >39.00)
LDL Cholesterol: 87 mg/dL (ref 0–99)
NONHDL: 103.41
TRIGLYCERIDES: 82 mg/dL (ref 0.0–149.0)
VLDL: 16.4 mg/dL (ref 0.0–40.0)

## 2017-07-15 LAB — HEPATIC FUNCTION PANEL
ALBUMIN: 4.3 g/dL (ref 3.5–5.2)
ALT: 20 U/L (ref 0–35)
AST: 19 U/L (ref 0–37)
Alkaline Phosphatase: 51 U/L (ref 39–117)
Bilirubin, Direct: 0.1 mg/dL (ref 0.0–0.3)
TOTAL PROTEIN: 6.8 g/dL (ref 6.0–8.3)
Total Bilirubin: 0.5 mg/dL (ref 0.2–1.2)

## 2017-07-15 LAB — HEMOGLOBIN A1C: Hgb A1c MFr Bld: 6 % (ref 4.6–6.5)

## 2017-07-15 LAB — TSH: TSH: 1.59 u[IU]/mL (ref 0.35–4.50)

## 2017-07-15 NOTE — Patient Instructions (Signed)
It was great seeing you today   Please go to the [place] office today for a chest xray and I will follow up with you regarding your blood work and chest xray.

## 2017-07-15 NOTE — Progress Notes (Signed)
Spoke with patient gave lab & xray results & recommendations to start Prilosec OTC x 1 week or so.  And to call back to say how it may or may not working for her.

## 2017-07-16 LAB — HEPATITIS C ANTIBODY
Hepatitis C Ab: NONREACTIVE
SIGNAL TO CUT-OFF: 0 (ref <1.00)

## 2017-07-25 ENCOUNTER — Encounter: Payer: Self-pay | Admitting: Adult Health

## 2017-07-28 ENCOUNTER — Encounter: Payer: Self-pay | Admitting: Adult Health

## 2017-07-29 NOTE — Telephone Encounter (Signed)
Please advise Diane Perez. Thanks.

## 2017-08-05 ENCOUNTER — Other Ambulatory Visit: Payer: Self-pay | Admitting: Family Medicine

## 2017-08-05 NOTE — Telephone Encounter (Signed)
Cory patient  

## 2017-08-06 NOTE — Telephone Encounter (Signed)
SENT IN ON 04/07/17 FOR NINE MONTHS.  MESSAGE SENT TO THE PHARMACY TO CHECK FILE.

## 2017-08-09 ENCOUNTER — Encounter: Payer: Self-pay | Admitting: Adult Health

## 2017-08-11 ENCOUNTER — Encounter: Payer: Self-pay | Admitting: Adult Health

## 2017-08-11 MED ORDER — LOVASTATIN 40 MG PO TABS: 40.0000 mg | ORAL_TABLET | Freq: Every day | ORAL | 3 refills | Status: DC

## 2017-09-14 ENCOUNTER — Other Ambulatory Visit: Payer: Self-pay | Admitting: Family Medicine

## 2017-09-14 DIAGNOSIS — E1110 Type 2 diabetes mellitus with ketoacidosis without coma: Secondary | ICD-10-CM

## 2017-10-06 DIAGNOSIS — E119 Type 2 diabetes mellitus without complications: Secondary | ICD-10-CM | POA: Diagnosis present

## 2017-10-06 DIAGNOSIS — R4702 Dysphasia: Secondary | ICD-10-CM | POA: Diagnosis not present

## 2017-10-06 DIAGNOSIS — I10 Essential (primary) hypertension: Secondary | ICD-10-CM | POA: Diagnosis present

## 2017-10-06 DIAGNOSIS — Z789 Other specified health status: Secondary | ICD-10-CM | POA: Diagnosis not present

## 2017-10-06 DIAGNOSIS — E785 Hyperlipidemia, unspecified: Secondary | ICD-10-CM | POA: Diagnosis not present

## 2017-10-06 DIAGNOSIS — Z7984 Long term (current) use of oral hypoglycemic drugs: Secondary | ICD-10-CM | POA: Diagnosis not present

## 2017-10-06 DIAGNOSIS — I6789 Other cerebrovascular disease: Secondary | ICD-10-CM | POA: Diagnosis not present

## 2017-10-06 DIAGNOSIS — G459 Transient cerebral ischemic attack, unspecified: Secondary | ICD-10-CM | POA: Diagnosis not present

## 2017-10-06 DIAGNOSIS — C719 Malignant neoplasm of brain, unspecified: Secondary | ICD-10-CM | POA: Diagnosis not present

## 2017-10-06 DIAGNOSIS — R4182 Altered mental status, unspecified: Secondary | ICD-10-CM | POA: Diagnosis not present

## 2017-10-06 DIAGNOSIS — G930 Cerebral cysts: Secondary | ICD-10-CM | POA: Diagnosis not present

## 2017-10-06 DIAGNOSIS — R569 Unspecified convulsions: Secondary | ICD-10-CM | POA: Diagnosis not present

## 2017-10-06 DIAGNOSIS — Z7982 Long term (current) use of aspirin: Secondary | ICD-10-CM | POA: Diagnosis not present

## 2017-10-06 DIAGNOSIS — R000 Tachycardia, unspecified: Secondary | ICD-10-CM | POA: Diagnosis not present

## 2017-10-06 DIAGNOSIS — R220 Localized swelling, mass and lump, head: Secondary | ICD-10-CM | POA: Diagnosis not present

## 2017-10-06 DIAGNOSIS — G939 Disorder of brain, unspecified: Secondary | ICD-10-CM | POA: Diagnosis not present

## 2017-10-06 DIAGNOSIS — R4701 Aphasia: Secondary | ICD-10-CM | POA: Diagnosis present

## 2017-10-30 ENCOUNTER — Telehealth: Payer: Self-pay | Admitting: Adult Health

## 2017-10-30 ENCOUNTER — Encounter: Payer: Self-pay | Admitting: Adult Health

## 2017-10-30 DIAGNOSIS — G930 Cerebral cysts: Secondary | ICD-10-CM | POA: Diagnosis not present

## 2017-10-30 DIAGNOSIS — G40109 Localization-related (focal) (partial) symptomatic epilepsy and epileptic syndromes with simple partial seizures, not intractable, without status epilepticus: Secondary | ICD-10-CM | POA: Diagnosis not present

## 2017-10-30 DIAGNOSIS — G9389 Other specified disorders of brain: Secondary | ICD-10-CM | POA: Diagnosis not present

## 2017-10-30 DIAGNOSIS — G939 Disorder of brain, unspecified: Secondary | ICD-10-CM | POA: Diagnosis not present

## 2017-10-30 NOTE — Telephone Encounter (Signed)
Sent by fax.  Received confirmation that the fax was successful.  No further action required.  Will close note.

## 2017-10-30 NOTE — Telephone Encounter (Signed)
Copied from [place] [id number]. Topic: Quick Communication - See Telephone Encounter >> Oct 30, 2017  1:01 PM Ivar Drape wrote: CRM for notification. See Telephone encounter for:  10/30/17. Patient's husband Gershon Mussel called to ask for a surgical clearance for his wife.  They are at [facility] in [place].  His wife has a cyst on her brain and the hospital want to do a biopsy and drain the cyst, but they need a Medical Clearance to have it done.  The hospital can take the clearance from either Dr. Sherren Mocha or Dorothyann Peng. The clearance can be faxed to [phone].

## 2017-10-30 NOTE — Telephone Encounter (Signed)
Surgical Clearance done.

## 2017-11-05 DIAGNOSIS — G939 Disorder of brain, unspecified: Secondary | ICD-10-CM | POA: Diagnosis not present

## 2017-11-05 DIAGNOSIS — C711 Malignant neoplasm of frontal lobe: Secondary | ICD-10-CM | POA: Diagnosis not present

## 2017-11-05 DIAGNOSIS — G40109 Localization-related (focal) (partial) symptomatic epilepsy and epileptic syndromes with simple partial seizures, not intractable, without status epilepticus: Secondary | ICD-10-CM | POA: Diagnosis not present

## 2017-11-05 DIAGNOSIS — D332 Benign neoplasm of brain, unspecified: Secondary | ICD-10-CM | POA: Diagnosis not present

## 2017-11-05 DIAGNOSIS — G9389 Other specified disorders of brain: Secondary | ICD-10-CM | POA: Diagnosis not present

## 2017-11-05 DIAGNOSIS — Z48811 Encounter for surgical aftercare following surgery on the nervous system: Secondary | ICD-10-CM | POA: Diagnosis not present

## 2017-11-05 DIAGNOSIS — Z79899 Other long term (current) drug therapy: Secondary | ICD-10-CM | POA: Diagnosis not present

## 2017-11-05 DIAGNOSIS — D496 Neoplasm of unspecified behavior of brain: Secondary | ICD-10-CM | POA: Diagnosis present

## 2017-11-05 DIAGNOSIS — R4701 Aphasia: Secondary | ICD-10-CM | POA: Diagnosis present

## 2017-11-05 DIAGNOSIS — G4089 Other seizures: Secondary | ICD-10-CM | POA: Diagnosis present

## 2017-11-19 DIAGNOSIS — G939 Disorder of brain, unspecified: Secondary | ICD-10-CM | POA: Diagnosis not present

## 2017-11-20 DIAGNOSIS — D496 Neoplasm of unspecified behavior of brain: Secondary | ICD-10-CM | POA: Diagnosis not present

## 2017-11-20 DIAGNOSIS — G40109 Localization-related (focal) (partial) symptomatic epilepsy and epileptic syndromes with simple partial seizures, not intractable, without status epilepticus: Secondary | ICD-10-CM | POA: Diagnosis not present

## 2017-11-20 DIAGNOSIS — C719 Malignant neoplasm of brain, unspecified: Secondary | ICD-10-CM | POA: Diagnosis not present

## 2017-11-20 DIAGNOSIS — G9389 Other specified disorders of brain: Secondary | ICD-10-CM | POA: Diagnosis not present

## 2017-11-20 DIAGNOSIS — G939 Disorder of brain, unspecified: Secondary | ICD-10-CM | POA: Diagnosis not present

## 2017-11-20 DIAGNOSIS — Z48811 Encounter for surgical aftercare following surgery on the nervous system: Secondary | ICD-10-CM | POA: Diagnosis not present

## 2017-12-15 ENCOUNTER — Encounter: Payer: Self-pay | Admitting: Adult Health

## 2017-12-16 ENCOUNTER — Other Ambulatory Visit: Payer: Self-pay | Admitting: Adult Health

## 2017-12-31 DIAGNOSIS — Z01810 Encounter for preprocedural cardiovascular examination: Secondary | ICD-10-CM | POA: Diagnosis not present

## 2017-12-31 DIAGNOSIS — R569 Unspecified convulsions: Secondary | ICD-10-CM | POA: Diagnosis not present

## 2017-12-31 DIAGNOSIS — Z01818 Encounter for other preprocedural examination: Secondary | ICD-10-CM | POA: Diagnosis not present

## 2017-12-31 DIAGNOSIS — C719 Malignant neoplasm of brain, unspecified: Secondary | ICD-10-CM | POA: Diagnosis not present

## 2017-12-31 DIAGNOSIS — E119 Type 2 diabetes mellitus without complications: Secondary | ICD-10-CM | POA: Diagnosis not present

## 2017-12-31 DIAGNOSIS — C710 Malignant neoplasm of cerebrum, except lobes and ventricles: Secondary | ICD-10-CM | POA: Diagnosis not present

## 2017-12-31 DIAGNOSIS — C711 Malignant neoplasm of frontal lobe: Secondary | ICD-10-CM | POA: Diagnosis not present

## 2018-01-01 DIAGNOSIS — R569 Unspecified convulsions: Secondary | ICD-10-CM | POA: Diagnosis present

## 2018-01-01 DIAGNOSIS — E119 Type 2 diabetes mellitus without complications: Secondary | ICD-10-CM | POA: Diagnosis present

## 2018-01-01 DIAGNOSIS — G9389 Other specified disorders of brain: Secondary | ICD-10-CM | POA: Diagnosis not present

## 2018-01-01 DIAGNOSIS — Z7984 Long term (current) use of oral hypoglycemic drugs: Secondary | ICD-10-CM | POA: Diagnosis not present

## 2018-01-01 DIAGNOSIS — E785 Hyperlipidemia, unspecified: Secondary | ICD-10-CM | POA: Diagnosis present

## 2018-01-01 DIAGNOSIS — C711 Malignant neoplasm of frontal lobe: Secondary | ICD-10-CM | POA: Diagnosis present

## 2018-01-01 DIAGNOSIS — G9751 Postprocedural hemorrhage and hematoma of a nervous system organ or structure following a nervous system procedure: Secondary | ICD-10-CM | POA: Diagnosis not present

## 2018-01-01 DIAGNOSIS — D496 Neoplasm of unspecified behavior of brain: Secondary | ICD-10-CM | POA: Diagnosis not present

## 2018-01-01 DIAGNOSIS — G939 Disorder of brain, unspecified: Secondary | ICD-10-CM | POA: Diagnosis not present

## 2018-01-01 DIAGNOSIS — G936 Cerebral edema: Secondary | ICD-10-CM | POA: Diagnosis present

## 2018-01-01 DIAGNOSIS — R4701 Aphasia: Secondary | ICD-10-CM | POA: Diagnosis not present

## 2018-01-01 DIAGNOSIS — Z7982 Long term (current) use of aspirin: Secondary | ICD-10-CM | POA: Diagnosis not present

## 2018-01-01 DIAGNOSIS — C719 Malignant neoplasm of brain, unspecified: Secondary | ICD-10-CM | POA: Diagnosis not present

## 2018-01-01 DIAGNOSIS — Z87891 Personal history of nicotine dependence: Secondary | ICD-10-CM | POA: Diagnosis not present

## 2018-01-12 DIAGNOSIS — J449 Chronic obstructive pulmonary disease, unspecified: Secondary | ICD-10-CM | POA: Diagnosis not present

## 2018-01-12 DIAGNOSIS — C711 Malignant neoplasm of frontal lobe: Secondary | ICD-10-CM | POA: Diagnosis not present

## 2018-01-22 DIAGNOSIS — Z9889 Other specified postprocedural states: Secondary | ICD-10-CM | POA: Diagnosis not present

## 2018-01-22 DIAGNOSIS — R110 Nausea: Secondary | ICD-10-CM | POA: Diagnosis not present

## 2018-01-22 DIAGNOSIS — C719 Malignant neoplasm of brain, unspecified: Secondary | ICD-10-CM | POA: Diagnosis not present

## 2018-01-22 DIAGNOSIS — Z8639 Personal history of other endocrine, nutritional and metabolic disease: Secondary | ICD-10-CM | POA: Diagnosis not present

## 2018-01-22 DIAGNOSIS — Z87891 Personal history of nicotine dependence: Secondary | ICD-10-CM | POA: Diagnosis not present

## 2018-01-22 DIAGNOSIS — C711 Malignant neoplasm of frontal lobe: Secondary | ICD-10-CM | POA: Diagnosis not present

## 2018-01-30 DIAGNOSIS — J439 Emphysema, unspecified: Secondary | ICD-10-CM | POA: Diagnosis not present

## 2018-01-30 DIAGNOSIS — J479 Bronchiectasis, uncomplicated: Secondary | ICD-10-CM | POA: Diagnosis not present

## 2018-01-30 DIAGNOSIS — J929 Pleural plaque without asbestos: Secondary | ICD-10-CM | POA: Diagnosis not present

## 2018-01-30 DIAGNOSIS — I2584 Coronary atherosclerosis due to calcified coronary lesion: Secondary | ICD-10-CM | POA: Diagnosis not present

## 2018-01-30 DIAGNOSIS — Z9889 Other specified postprocedural states: Secondary | ICD-10-CM | POA: Diagnosis not present

## 2018-01-30 DIAGNOSIS — Z982 Presence of cerebrospinal fluid drainage device: Secondary | ICD-10-CM | POA: Diagnosis not present

## 2018-01-30 DIAGNOSIS — C711 Malignant neoplasm of frontal lobe: Secondary | ICD-10-CM | POA: Diagnosis not present

## 2018-01-30 DIAGNOSIS — R05 Cough: Secondary | ICD-10-CM | POA: Diagnosis not present

## 2018-01-30 DIAGNOSIS — F17210 Nicotine dependence, cigarettes, uncomplicated: Secondary | ICD-10-CM | POA: Diagnosis not present

## 2018-02-09 DIAGNOSIS — Z510 Encounter for antineoplastic radiation therapy: Secondary | ICD-10-CM | POA: Diagnosis not present

## 2018-02-09 DIAGNOSIS — C711 Malignant neoplasm of frontal lobe: Secondary | ICD-10-CM | POA: Diagnosis not present

## 2018-02-10 DIAGNOSIS — C711 Malignant neoplasm of frontal lobe: Secondary | ICD-10-CM | POA: Diagnosis not present

## 2018-02-10 DIAGNOSIS — Z510 Encounter for antineoplastic radiation therapy: Secondary | ICD-10-CM | POA: Diagnosis not present

## 2018-02-11 DIAGNOSIS — C711 Malignant neoplasm of frontal lobe: Secondary | ICD-10-CM | POA: Diagnosis not present

## 2018-02-11 DIAGNOSIS — Z510 Encounter for antineoplastic radiation therapy: Secondary | ICD-10-CM | POA: Diagnosis not present

## 2018-02-12 DIAGNOSIS — Z5181 Encounter for therapeutic drug level monitoring: Secondary | ICD-10-CM | POA: Diagnosis not present

## 2018-02-12 DIAGNOSIS — Z79899 Other long term (current) drug therapy: Secondary | ICD-10-CM | POA: Diagnosis not present

## 2018-02-12 DIAGNOSIS — C719 Malignant neoplasm of brain, unspecified: Secondary | ICD-10-CM | POA: Diagnosis not present

## 2018-02-12 DIAGNOSIS — C711 Malignant neoplasm of frontal lobe: Secondary | ICD-10-CM | POA: Diagnosis not present

## 2018-02-12 DIAGNOSIS — Z510 Encounter for antineoplastic radiation therapy: Secondary | ICD-10-CM | POA: Diagnosis not present

## 2018-02-13 DIAGNOSIS — Z510 Encounter for antineoplastic radiation therapy: Secondary | ICD-10-CM | POA: Diagnosis not present

## 2018-02-13 DIAGNOSIS — C711 Malignant neoplasm of frontal lobe: Secondary | ICD-10-CM | POA: Diagnosis not present

## 2018-02-16 DIAGNOSIS — J449 Chronic obstructive pulmonary disease, unspecified: Secondary | ICD-10-CM | POA: Diagnosis not present

## 2018-02-16 DIAGNOSIS — Z510 Encounter for antineoplastic radiation therapy: Secondary | ICD-10-CM | POA: Diagnosis not present

## 2018-02-16 DIAGNOSIS — R05 Cough: Secondary | ICD-10-CM | POA: Diagnosis not present

## 2018-02-16 DIAGNOSIS — C711 Malignant neoplasm of frontal lobe: Secondary | ICD-10-CM | POA: Diagnosis not present

## 2018-02-17 DIAGNOSIS — Z510 Encounter for antineoplastic radiation therapy: Secondary | ICD-10-CM | POA: Diagnosis not present

## 2018-02-17 DIAGNOSIS — C711 Malignant neoplasm of frontal lobe: Secondary | ICD-10-CM | POA: Diagnosis not present

## 2018-02-18 DIAGNOSIS — Z510 Encounter for antineoplastic radiation therapy: Secondary | ICD-10-CM | POA: Diagnosis not present

## 2018-02-18 DIAGNOSIS — C711 Malignant neoplasm of frontal lobe: Secondary | ICD-10-CM | POA: Diagnosis not present

## 2018-02-19 DIAGNOSIS — Z510 Encounter for antineoplastic radiation therapy: Secondary | ICD-10-CM | POA: Diagnosis not present

## 2018-02-19 DIAGNOSIS — Z87891 Personal history of nicotine dependence: Secondary | ICD-10-CM | POA: Diagnosis not present

## 2018-02-19 DIAGNOSIS — C711 Malignant neoplasm of frontal lobe: Secondary | ICD-10-CM | POA: Diagnosis not present

## 2018-02-19 DIAGNOSIS — Z5181 Encounter for therapeutic drug level monitoring: Secondary | ICD-10-CM | POA: Diagnosis not present

## 2018-02-20 DIAGNOSIS — C711 Malignant neoplasm of frontal lobe: Secondary | ICD-10-CM | POA: Diagnosis not present

## 2018-02-20 DIAGNOSIS — Z510 Encounter for antineoplastic radiation therapy: Secondary | ICD-10-CM | POA: Diagnosis not present

## 2018-02-23 DIAGNOSIS — C711 Malignant neoplasm of frontal lobe: Secondary | ICD-10-CM | POA: Diagnosis not present

## 2018-02-23 DIAGNOSIS — Z510 Encounter for antineoplastic radiation therapy: Secondary | ICD-10-CM | POA: Diagnosis not present

## 2018-02-24 DIAGNOSIS — Z510 Encounter for antineoplastic radiation therapy: Secondary | ICD-10-CM | POA: Diagnosis not present

## 2018-02-24 DIAGNOSIS — C711 Malignant neoplasm of frontal lobe: Secondary | ICD-10-CM | POA: Diagnosis not present

## 2018-02-25 DIAGNOSIS — C711 Malignant neoplasm of frontal lobe: Secondary | ICD-10-CM | POA: Diagnosis not present

## 2018-02-25 DIAGNOSIS — Z510 Encounter for antineoplastic radiation therapy: Secondary | ICD-10-CM | POA: Diagnosis not present

## 2018-02-26 DIAGNOSIS — Z5181 Encounter for therapeutic drug level monitoring: Secondary | ICD-10-CM | POA: Diagnosis not present

## 2018-02-26 DIAGNOSIS — Z510 Encounter for antineoplastic radiation therapy: Secondary | ICD-10-CM | POA: Diagnosis not present

## 2018-02-26 DIAGNOSIS — C711 Malignant neoplasm of frontal lobe: Secondary | ICD-10-CM | POA: Diagnosis not present

## 2018-02-27 DIAGNOSIS — C711 Malignant neoplasm of frontal lobe: Secondary | ICD-10-CM | POA: Diagnosis not present

## 2018-02-27 DIAGNOSIS — Z510 Encounter for antineoplastic radiation therapy: Secondary | ICD-10-CM | POA: Diagnosis not present

## 2018-03-02 DIAGNOSIS — Z510 Encounter for antineoplastic radiation therapy: Secondary | ICD-10-CM | POA: Diagnosis not present

## 2018-03-02 DIAGNOSIS — C711 Malignant neoplasm of frontal lobe: Secondary | ICD-10-CM | POA: Diagnosis not present

## 2018-03-03 DIAGNOSIS — Z510 Encounter for antineoplastic radiation therapy: Secondary | ICD-10-CM | POA: Diagnosis not present

## 2018-03-03 DIAGNOSIS — C711 Malignant neoplasm of frontal lobe: Secondary | ICD-10-CM | POA: Diagnosis not present

## 2018-03-04 DIAGNOSIS — Z510 Encounter for antineoplastic radiation therapy: Secondary | ICD-10-CM | POA: Diagnosis not present

## 2018-03-04 DIAGNOSIS — C711 Malignant neoplasm of frontal lobe: Secondary | ICD-10-CM | POA: Diagnosis not present

## 2018-03-05 DIAGNOSIS — Z5181 Encounter for therapeutic drug level monitoring: Secondary | ICD-10-CM | POA: Diagnosis not present

## 2018-03-05 DIAGNOSIS — C711 Malignant neoplasm of frontal lobe: Secondary | ICD-10-CM | POA: Diagnosis not present

## 2018-03-05 DIAGNOSIS — Z510 Encounter for antineoplastic radiation therapy: Secondary | ICD-10-CM | POA: Diagnosis not present

## 2018-03-06 DIAGNOSIS — C711 Malignant neoplasm of frontal lobe: Secondary | ICD-10-CM | POA: Diagnosis not present

## 2018-03-06 DIAGNOSIS — Z510 Encounter for antineoplastic radiation therapy: Secondary | ICD-10-CM | POA: Diagnosis not present

## 2018-03-09 DIAGNOSIS — Z510 Encounter for antineoplastic radiation therapy: Secondary | ICD-10-CM | POA: Diagnosis not present

## 2018-03-09 DIAGNOSIS — C711 Malignant neoplasm of frontal lobe: Secondary | ICD-10-CM | POA: Diagnosis not present

## 2018-03-10 DIAGNOSIS — Z510 Encounter for antineoplastic radiation therapy: Secondary | ICD-10-CM | POA: Diagnosis not present

## 2018-03-10 DIAGNOSIS — C711 Malignant neoplasm of frontal lobe: Secondary | ICD-10-CM | POA: Diagnosis not present

## 2018-03-11 DIAGNOSIS — C711 Malignant neoplasm of frontal lobe: Secondary | ICD-10-CM | POA: Diagnosis not present

## 2018-03-11 DIAGNOSIS — Z510 Encounter for antineoplastic radiation therapy: Secondary | ICD-10-CM | POA: Diagnosis not present

## 2018-03-12 DIAGNOSIS — Z510 Encounter for antineoplastic radiation therapy: Secondary | ICD-10-CM | POA: Diagnosis not present

## 2018-03-12 DIAGNOSIS — C711 Malignant neoplasm of frontal lobe: Secondary | ICD-10-CM | POA: Diagnosis not present

## 2018-03-12 DIAGNOSIS — Z5181 Encounter for therapeutic drug level monitoring: Secondary | ICD-10-CM | POA: Diagnosis not present

## 2018-03-13 DIAGNOSIS — Z510 Encounter for antineoplastic radiation therapy: Secondary | ICD-10-CM | POA: Diagnosis not present

## 2018-03-13 DIAGNOSIS — C711 Malignant neoplasm of frontal lobe: Secondary | ICD-10-CM | POA: Diagnosis not present

## 2018-03-16 DIAGNOSIS — C711 Malignant neoplasm of frontal lobe: Secondary | ICD-10-CM | POA: Diagnosis not present

## 2018-03-16 DIAGNOSIS — Z510 Encounter for antineoplastic radiation therapy: Secondary | ICD-10-CM | POA: Diagnosis not present

## 2018-03-17 DIAGNOSIS — Z510 Encounter for antineoplastic radiation therapy: Secondary | ICD-10-CM | POA: Diagnosis not present

## 2018-03-17 DIAGNOSIS — C711 Malignant neoplasm of frontal lobe: Secondary | ICD-10-CM | POA: Diagnosis not present

## 2018-03-18 DIAGNOSIS — Z510 Encounter for antineoplastic radiation therapy: Secondary | ICD-10-CM | POA: Diagnosis not present

## 2018-03-18 DIAGNOSIS — C711 Malignant neoplasm of frontal lobe: Secondary | ICD-10-CM | POA: Diagnosis not present

## 2018-03-19 DIAGNOSIS — Z5181 Encounter for therapeutic drug level monitoring: Secondary | ICD-10-CM | POA: Diagnosis not present

## 2018-03-19 DIAGNOSIS — C711 Malignant neoplasm of frontal lobe: Secondary | ICD-10-CM | POA: Diagnosis not present

## 2018-03-19 DIAGNOSIS — Z510 Encounter for antineoplastic radiation therapy: Secondary | ICD-10-CM | POA: Diagnosis not present

## 2018-03-20 DIAGNOSIS — Z510 Encounter for antineoplastic radiation therapy: Secondary | ICD-10-CM | POA: Diagnosis not present

## 2018-03-20 DIAGNOSIS — C711 Malignant neoplasm of frontal lobe: Secondary | ICD-10-CM | POA: Diagnosis not present

## 2018-03-23 DIAGNOSIS — C711 Malignant neoplasm of frontal lobe: Secondary | ICD-10-CM | POA: Diagnosis not present

## 2018-03-23 DIAGNOSIS — Z510 Encounter for antineoplastic radiation therapy: Secondary | ICD-10-CM | POA: Diagnosis not present

## 2018-03-24 DIAGNOSIS — C711 Malignant neoplasm of frontal lobe: Secondary | ICD-10-CM | POA: Diagnosis not present

## 2018-03-24 DIAGNOSIS — Z510 Encounter for antineoplastic radiation therapy: Secondary | ICD-10-CM | POA: Diagnosis not present

## 2018-03-24 DIAGNOSIS — C719 Malignant neoplasm of brain, unspecified: Secondary | ICD-10-CM | POA: Diagnosis not present

## 2018-04-27 DIAGNOSIS — Z923 Personal history of irradiation: Secondary | ICD-10-CM | POA: Diagnosis not present

## 2018-04-27 DIAGNOSIS — C729 Malignant neoplasm of central nervous system, unspecified: Secondary | ICD-10-CM | POA: Diagnosis not present

## 2018-04-27 DIAGNOSIS — Z9889 Other specified postprocedural states: Secondary | ICD-10-CM | POA: Diagnosis not present

## 2018-04-27 DIAGNOSIS — J449 Chronic obstructive pulmonary disease, unspecified: Secondary | ICD-10-CM | POA: Diagnosis not present

## 2018-04-27 DIAGNOSIS — R05 Cough: Secondary | ICD-10-CM | POA: Diagnosis not present

## 2018-04-27 DIAGNOSIS — C719 Malignant neoplasm of brain, unspecified: Secondary | ICD-10-CM | POA: Diagnosis not present

## 2018-04-27 DIAGNOSIS — Z982 Presence of cerebrospinal fluid drainage device: Secondary | ICD-10-CM | POA: Diagnosis not present

## 2018-04-27 DIAGNOSIS — Z9221 Personal history of antineoplastic chemotherapy: Secondary | ICD-10-CM | POA: Diagnosis not present

## 2018-04-30 DIAGNOSIS — Z9889 Other specified postprocedural states: Secondary | ICD-10-CM | POA: Diagnosis not present

## 2018-04-30 DIAGNOSIS — Z923 Personal history of irradiation: Secondary | ICD-10-CM | POA: Diagnosis not present

## 2018-04-30 DIAGNOSIS — C711 Malignant neoplasm of frontal lobe: Secondary | ICD-10-CM | POA: Diagnosis not present

## 2018-05-18 DIAGNOSIS — C719 Malignant neoplasm of brain, unspecified: Secondary | ICD-10-CM | POA: Diagnosis not present

## 2018-06-15 DIAGNOSIS — C719 Malignant neoplasm of brain, unspecified: Secondary | ICD-10-CM | POA: Diagnosis not present

## 2018-06-29 DIAGNOSIS — C711 Malignant neoplasm of frontal lobe: Secondary | ICD-10-CM | POA: Diagnosis not present

## 2018-06-29 DIAGNOSIS — R05 Cough: Secondary | ICD-10-CM | POA: Diagnosis not present

## 2018-06-29 DIAGNOSIS — C729 Malignant neoplasm of central nervous system, unspecified: Secondary | ICD-10-CM | POA: Diagnosis not present

## 2018-06-29 DIAGNOSIS — C717 Malignant neoplasm of brain stem: Secondary | ICD-10-CM | POA: Diagnosis not present

## 2018-06-29 DIAGNOSIS — C719 Malignant neoplasm of brain, unspecified: Secondary | ICD-10-CM | POA: Diagnosis not present

## 2018-06-29 DIAGNOSIS — Z923 Personal history of irradiation: Secondary | ICD-10-CM | POA: Diagnosis not present

## 2018-06-29 DIAGNOSIS — Z5181 Encounter for therapeutic drug level monitoring: Secondary | ICD-10-CM | POA: Diagnosis not present

## 2018-06-29 DIAGNOSIS — Z9889 Other specified postprocedural states: Secondary | ICD-10-CM | POA: Diagnosis not present

## 2018-06-29 DIAGNOSIS — J449 Chronic obstructive pulmonary disease, unspecified: Secondary | ICD-10-CM | POA: Diagnosis not present

## 2018-06-29 DIAGNOSIS — R569 Unspecified convulsions: Secondary | ICD-10-CM | POA: Diagnosis not present

## 2018-06-29 DIAGNOSIS — R092 Respiratory arrest: Secondary | ICD-10-CM | POA: Diagnosis not present

## 2018-07-13 DIAGNOSIS — Z5181 Encounter for therapeutic drug level monitoring: Secondary | ICD-10-CM | POA: Diagnosis not present

## 2018-07-13 DIAGNOSIS — C719 Malignant neoplasm of brain, unspecified: Secondary | ICD-10-CM | POA: Diagnosis not present

## 2018-08-06 ENCOUNTER — Other Ambulatory Visit: Payer: Self-pay | Admitting: Adult Health

## 2018-08-10 DIAGNOSIS — C719 Malignant neoplasm of brain, unspecified: Secondary | ICD-10-CM | POA: Diagnosis not present

## 2018-08-31 DIAGNOSIS — R21 Rash and other nonspecific skin eruption: Secondary | ICD-10-CM | POA: Diagnosis not present

## 2018-08-31 DIAGNOSIS — C719 Malignant neoplasm of brain, unspecified: Secondary | ICD-10-CM | POA: Diagnosis not present

## 2018-08-31 DIAGNOSIS — R569 Unspecified convulsions: Secondary | ICD-10-CM | POA: Diagnosis not present

## 2018-08-31 DIAGNOSIS — Z5181 Encounter for therapeutic drug level monitoring: Secondary | ICD-10-CM | POA: Diagnosis not present

## 2018-08-31 DIAGNOSIS — C711 Malignant neoplasm of frontal lobe: Secondary | ICD-10-CM | POA: Diagnosis not present

## 2018-08-31 DIAGNOSIS — C717 Malignant neoplasm of brain stem: Secondary | ICD-10-CM | POA: Diagnosis not present

## 2018-08-31 DIAGNOSIS — G936 Cerebral edema: Secondary | ICD-10-CM | POA: Diagnosis not present

## 2018-09-09 ENCOUNTER — Other Ambulatory Visit: Payer: Self-pay | Admitting: Family Medicine

## 2018-09-09 DIAGNOSIS — E1110 Type 2 diabetes mellitus with ketoacidosis without coma: Secondary | ICD-10-CM

## 2018-09-09 DIAGNOSIS — Z5181 Encounter for therapeutic drug level monitoring: Secondary | ICD-10-CM | POA: Diagnosis not present

## 2018-09-09 NOTE — Telephone Encounter (Signed)
Rx filled for 30 days 

## 2018-09-09 NOTE — Telephone Encounter (Signed)
Patient need to schedule an ov for more refills. 

## 2018-09-22 DIAGNOSIS — Z23 Encounter for immunization: Secondary | ICD-10-CM | POA: Diagnosis not present

## 2018-09-22 DIAGNOSIS — E782 Mixed hyperlipidemia: Secondary | ICD-10-CM | POA: Diagnosis not present

## 2018-09-22 DIAGNOSIS — F5101 Primary insomnia: Secondary | ICD-10-CM | POA: Diagnosis not present

## 2018-09-22 DIAGNOSIS — Z0000 Encounter for general adult medical examination without abnormal findings: Secondary | ICD-10-CM | POA: Diagnosis not present

## 2018-09-22 DIAGNOSIS — C719 Malignant neoplasm of brain, unspecified: Secondary | ICD-10-CM | POA: Diagnosis not present

## 2018-09-22 DIAGNOSIS — Z6823 Body mass index (BMI) 23.0-23.9, adult: Secondary | ICD-10-CM | POA: Diagnosis not present

## 2018-09-22 DIAGNOSIS — R569 Unspecified convulsions: Secondary | ICD-10-CM | POA: Diagnosis not present

## 2018-09-22 DIAGNOSIS — E119 Type 2 diabetes mellitus without complications: Secondary | ICD-10-CM | POA: Diagnosis not present

## 2018-09-22 DIAGNOSIS — Z1239 Encounter for other screening for malignant neoplasm of breast: Secondary | ICD-10-CM | POA: Diagnosis not present

## 2018-09-23 DIAGNOSIS — Z1231 Encounter for screening mammogram for malignant neoplasm of breast: Secondary | ICD-10-CM | POA: Diagnosis not present

## 2018-10-05 DIAGNOSIS — C719 Malignant neoplasm of brain, unspecified: Secondary | ICD-10-CM | POA: Diagnosis not present

## 2018-11-02 DIAGNOSIS — E119 Type 2 diabetes mellitus without complications: Secondary | ICD-10-CM | POA: Diagnosis not present

## 2018-11-02 DIAGNOSIS — Z9889 Other specified postprocedural states: Secondary | ICD-10-CM | POA: Diagnosis not present

## 2018-11-02 DIAGNOSIS — Z5181 Encounter for therapeutic drug level monitoring: Secondary | ICD-10-CM | POA: Diagnosis not present

## 2018-11-02 DIAGNOSIS — C718 Malignant neoplasm of overlapping sites of brain: Secondary | ICD-10-CM | POA: Diagnosis not present

## 2018-11-02 DIAGNOSIS — C719 Malignant neoplasm of brain, unspecified: Secondary | ICD-10-CM | POA: Diagnosis not present

## 2018-11-02 DIAGNOSIS — C717 Malignant neoplasm of brain stem: Secondary | ICD-10-CM | POA: Diagnosis not present

## 2018-11-02 DIAGNOSIS — R569 Unspecified convulsions: Secondary | ICD-10-CM | POA: Diagnosis not present

## 2018-11-09 DIAGNOSIS — Z5181 Encounter for therapeutic drug level monitoring: Secondary | ICD-10-CM | POA: Diagnosis not present

## 2018-12-04 DIAGNOSIS — Z5181 Encounter for therapeutic drug level monitoring: Secondary | ICD-10-CM | POA: Diagnosis not present

## 2019-01-04 DIAGNOSIS — C719 Malignant neoplasm of brain, unspecified: Secondary | ICD-10-CM | POA: Diagnosis not present

## 2019-01-04 DIAGNOSIS — G9389 Other specified disorders of brain: Secondary | ICD-10-CM | POA: Diagnosis not present

## 2019-01-05 DIAGNOSIS — Z5181 Encounter for therapeutic drug level monitoring: Secondary | ICD-10-CM | POA: Diagnosis not present

## 2019-02-08 DIAGNOSIS — Z5181 Encounter for therapeutic drug level monitoring: Secondary | ICD-10-CM | POA: Diagnosis not present

## 2019-03-11 DIAGNOSIS — Z5181 Encounter for therapeutic drug level monitoring: Secondary | ICD-10-CM | POA: Diagnosis not present

## 2019-03-11 DIAGNOSIS — C719 Malignant neoplasm of brain, unspecified: Secondary | ICD-10-CM | POA: Diagnosis not present

## 2019-03-30 DIAGNOSIS — C719 Malignant neoplasm of brain, unspecified: Secondary | ICD-10-CM | POA: Diagnosis not present

## 2019-03-30 DIAGNOSIS — R9089 Other abnormal findings on diagnostic imaging of central nervous system: Secondary | ICD-10-CM | POA: Diagnosis not present

## 2019-03-30 DIAGNOSIS — Z5181 Encounter for therapeutic drug level monitoring: Secondary | ICD-10-CM | POA: Diagnosis not present

## 2019-03-30 DIAGNOSIS — R110 Nausea: Secondary | ICD-10-CM | POA: Diagnosis not present

## 2019-04-06 DIAGNOSIS — Z5181 Encounter for therapeutic drug level monitoring: Secondary | ICD-10-CM | POA: Diagnosis not present

## 2019-04-13 ENCOUNTER — Other Ambulatory Visit: Payer: Self-pay | Admitting: Adult Health

## 2019-04-13 DIAGNOSIS — E1110 Type 2 diabetes mellitus with ketoacidosis without coma: Secondary | ICD-10-CM

## 2019-04-14 NOTE — Telephone Encounter (Signed)
Denied.  Pt not seen since 2018.  Will need appointment.

## 2019-05-03 DIAGNOSIS — Z5181 Encounter for therapeutic drug level monitoring: Secondary | ICD-10-CM | POA: Diagnosis not present

## 2019-05-03 DIAGNOSIS — C719 Malignant neoplasm of brain, unspecified: Secondary | ICD-10-CM | POA: Diagnosis not present

## 2019-05-10 DIAGNOSIS — C719 Malignant neoplasm of brain, unspecified: Secondary | ICD-10-CM | POA: Diagnosis not present

## 2019-05-10 DIAGNOSIS — Z9889 Other specified postprocedural states: Secondary | ICD-10-CM | POA: Diagnosis not present

## 2019-05-13 ENCOUNTER — Encounter: Payer: Self-pay | Admitting: Adult Health

## 2019-05-13 ENCOUNTER — Other Ambulatory Visit: Payer: Self-pay | Admitting: Adult Health

## 2019-05-13 MED ORDER — FREESTYLE LITE TEST VI STRP: ORAL_STRIP | 6 refills | Status: AC

## 2019-05-13 MED ORDER — FREESTYLE LANCETS MISC: 6 refills | Status: AC

## 2019-05-25 ENCOUNTER — Encounter: Payer: Self-pay | Admitting: Adult Health

## 2019-06-02 ENCOUNTER — Other Ambulatory Visit: Payer: Self-pay

## 2019-06-08 ENCOUNTER — Other Ambulatory Visit: Payer: Self-pay | Admitting: Hematology

## 2019-06-08 DIAGNOSIS — C719 Malignant neoplasm of brain, unspecified: Secondary | ICD-10-CM

## 2019-07-08 DIAGNOSIS — C719 Malignant neoplasm of brain, unspecified: Secondary | ICD-10-CM | POA: Diagnosis not present

## 2019-07-08 DIAGNOSIS — Z5181 Encounter for therapeutic drug level monitoring: Secondary | ICD-10-CM | POA: Diagnosis not present

## 2019-07-13 ENCOUNTER — Ambulatory Visit
Admission: RE | Admit: 2019-07-13 | Discharge: 2019-07-13 | Disposition: A | Discharge status: Home / Self Care | Payer: Medicare Other | Source: Ambulatory Visit | Attending: Hematology | Admitting: Hematology

## 2019-07-13 DIAGNOSIS — C719 Malignant neoplasm of brain, unspecified: Secondary | ICD-10-CM

## 2019-07-13 DIAGNOSIS — R9082 White matter disease, unspecified: Secondary | ICD-10-CM | POA: Diagnosis not present

## 2019-07-13 MED ORDER — GADOBENATE DIMEGLUMINE 529 MG/ML IV SOLN
12.0000 mL | Freq: Once | INTRAVENOUS | Status: AC | PRN
  Administered 2019-07-13: 12 mL via INTRAVENOUS

## 2019-07-14 ENCOUNTER — Encounter: Payer: Self-pay | Admitting: Adult Health

## 2019-07-14 ENCOUNTER — Ambulatory Visit (INDEPENDENT_AMBULATORY_CARE_PROVIDER_SITE_OTHER): Payer: Medicare Other | Admitting: Adult Health

## 2019-07-14 VITALS — BP 120/66 | Temp 98.0°F | Ht 61.5 in | Wt 138.0 lb

## 2019-07-14 DIAGNOSIS — E139 Other specified diabetes mellitus without complications: Secondary | ICD-10-CM | POA: Diagnosis not present

## 2019-07-14 DIAGNOSIS — E782 Mixed hyperlipidemia: Secondary | ICD-10-CM

## 2019-07-14 DIAGNOSIS — C719 Malignant neoplasm of brain, unspecified: Secondary | ICD-10-CM | POA: Diagnosis not present

## 2019-07-14 LAB — CBC WITH DIFFERENTIAL/PLATELET
Basophils Absolute: 0 10*3/uL (ref 0.0–0.1)
Basophils Relative: 0.4 % (ref 0.0–3.0)
Eosinophils Absolute: 0.1 10*3/uL (ref 0.0–0.7)
Eosinophils Relative: 1.7 % (ref 0.0–5.0)
HCT: 36.2 % (ref 36.0–46.0)
Hemoglobin: 12.1 g/dL (ref 12.0–15.0)
Lymphocytes Relative: 27 % (ref 12.0–46.0)
Lymphs Abs: 0.9 10*3/uL (ref 0.7–4.0)
MCHC: 33.4 g/dL (ref 30.0–36.0)
MCV: 101.6 fl — ABNORMAL HIGH (ref 78.0–100.0)
Monocytes Absolute: 0.5 10*3/uL (ref 0.1–1.0)
Monocytes Relative: 13.6 % — ABNORMAL HIGH (ref 3.0–12.0)
Neutro Abs: 2 10*3/uL (ref 1.4–7.7)
Neutrophils Relative %: 57.3 % (ref 43.0–77.0)
Platelets: 242 10*3/uL (ref 150.0–400.0)
RBC: 3.57 Mil/uL — ABNORMAL LOW (ref 3.87–5.11)
RDW: 13.7 % (ref 11.5–15.5)
WBC: 3.5 10*3/uL — ABNORMAL LOW (ref 4.0–10.5)

## 2019-07-14 LAB — LIPID PANEL
Cholesterol: 160 mg/dL (ref 0–200)
HDL: 60.4 mg/dL (ref >39.00)
LDL Cholesterol: 75 mg/dL (ref 0–99)
NonHDL: 99.17
Total CHOL/HDL Ratio: 3
Triglycerides: 121 mg/dL (ref 0.0–149.0)
VLDL: 24.2 mg/dL (ref 0.0–40.0)

## 2019-07-14 LAB — COMPREHENSIVE METABOLIC PANEL
ALT: 16 U/L (ref 0–35)
AST: 16 U/L (ref 0–37)
Albumin: 4.2 g/dL (ref 3.5–5.2)
Alkaline Phosphatase: 51 U/L (ref 39–117)
BUN: 13 mg/dL (ref 6–23)
CO2: 29 mEq/L (ref 19–32)
Calcium: 9.3 mg/dL (ref 8.4–10.5)
Chloride: 105 mEq/L (ref 96–112)
Creatinine, Ser: 0.72 mg/dL (ref 0.40–1.20)
GFR: 79.69 mL/min (ref >60.00)
Glucose, Bld: 96 mg/dL (ref 70–99)
Potassium: 4.2 mEq/L (ref 3.5–5.1)
Sodium: 141 mEq/L (ref 135–145)
Total Bilirubin: 0.7 mg/dL (ref 0.2–1.2)
Total Protein: 7.2 g/dL (ref 6.0–8.3)

## 2019-07-14 LAB — TSH: TSH: 1.5 u[IU]/mL (ref 0.35–4.50)

## 2019-07-14 LAB — HEMOGLOBIN A1C: Hgb A1c MFr Bld: 5.8 % (ref 4.6–6.5)

## 2019-07-14 NOTE — Progress Notes (Signed)
Subjective:    Patient ID: Diane Perez, female    DOB: 12/20/1945, 73 y.o.   MRN: [id number]  HPI Patient presents for annual medical exam. She is a pleasant 73 year old female who  has a past medical history of Diabetes mellitus, Glioblastoma ([place]) (2018), Hyperlipidemia, and Osteoporosis.  She travels the country in her RV with her husband. She was last seen in 07/2017   Hyperlipidemia - Takes Lovastatin 40 mg and ASA 81 mg daily.   DM- Currently prescribed Metfomrin 500 mg XR. She reports readings in the 120-150's when she does check her blood sugar. Denies episodes of hypoglycemia   Malignant neoplasm of cerebrum - Was diagnosed in December 2018 with glioblastoma. She has underwent radiation and chemo therapy and surgical resection. She was placed on Keppra. She is being managed by [facility] in [place]. She reports doing well, has no headaches, blurred vision, chest pain, or shortness of breath. She has MRI's every two months.   Her most recent MRI of brain ( yesterday) showed  IMPRESSION: 1. Left frontal craniotomy for resection of tumor in the posterior left frontal lobe. 2. No enhancement in or about the resection cavity to suggest residual or recurrent tumor. 3. T2 signal changes surround the resection cavity. This may be related to treatment. 4. Other white matter changes are mildly advanced for age. This may related to treatment for the GBM. White matter disease is also considered.  All immunizations and health maintenance protocols were reviewed with the patient and needed orders were placed. She is up to date   Appropriate screening laboratory values were ordered for the patient including screening of hyperlipidemia, renal function and hepatic function.  Medication reconciliation,  past medical history, social history, problem list and allergies were reviewed in detail with the patient  Goals were established with regard to weight loss, exercise,  and  diet in compliance with medications  End of life planning was discussed.   Review of Systems  Constitutional: Negative.   HENT: Negative.   Eyes: Negative.   Respiratory: Negative.   Cardiovascular: Negative.   Gastrointestinal: Negative.   Endocrine: Negative.   Genitourinary: Negative.   Musculoskeletal: Negative.   Skin: Negative.   Allergic/Immunologic: Negative.   Neurological: Negative.   Hematological: Negative.   Psychiatric/Behavioral: Negative.    Past Medical History:  Diagnosis Date  . Diabetes mellitus   . Glioblastoma ([place]) 2018  . Hyperlipidemia   . Osteoporosis     Social History   Socioeconomic History  . Marital status: Married    Spouse name: Not on file  . Number of children: Not on file  . Years of education: Not on file  . Highest education level: Not on file  Occupational History  . Not on file  Social Needs  . Financial resource strain: Not on file  . Food insecurity    Worry: Not on file    Inability: Not on file  . Transportation needs    Medical: Not on file    Non-medical: Not on file  Tobacco Use  . Smoking status: Former Research scientist (life sciences)  . Smokeless tobacco: Never Used  . Tobacco comment: 30 years ago  Substance and Sexual Activity  . Alcohol use: Yes    Alcohol/week: 0.0 standard drinks    Comment: 2 glasses of wine/nightly, weekends with occasional bourbon for the past 2-3 years  . Drug use: No  . Sexual activity: Not on file  Lifestyle  . Physical activity  Days per week: Not on file    Minutes per session: Not on file  . Stress: Not on file  Relationships  . Social Herbalist on phone: Not on file    Gets together: Not on file    Attends religious service: Not on file    Active member of club or organization: Not on file    Attends meetings of clubs or organizations: Not on file    Relationship status: Not on file  . Intimate partner violence    Fear of current or ex partner: Not on file    Emotionally  abused: Not on file    Physically abused: Not on file    Forced sexual activity: Not on file  Other Topics Concern  . Not on file  Social History Narrative   Lives with husband in a two story home.  No children.     Self employed.    Education: some college, works as travel Music therapist   Currently trying to sell home and buying motor home to travel throughout the [place].    Past Surgical History:  Procedure Laterality Date  . none      Family History  Problem Relation Age of Onset  . Diabetes Mother   . Asthma Father     No Known Allergies  Current Outpatient Medications on File Prior to Visit  Medication Sig Dispense Refill  . aspirin 81 MG EC tablet Take 81 mg by mouth daily.      Marland Kitchen glucose blood (FREESTYLE LITE) test strip Use as instructed 100 each 6  . Lancets (FREESTYLE) lancets Use as instructed 100 each 6  . lovastatin (MEVACOR) 40 MG tablet TAKE 1 TABLET AT BEDTIME 90 tablet 0  . metFORMIN (GLUCOPHAGE-XR) 500 MG 24 hr tablet TAKE 1 TABLET DAILY WITH BREAKFAST 30 tablet 0  . OVER THE COUNTER MEDICATION vinegar    . levETIRAcetam (KEPPRA) 250 MG tablet TK 1 T PO BID     No current facility-administered medications on file prior to visit.     BP 120/66   Temp 98 F (36.7 C) (Temporal)   Ht 5' 1.5" (1.562 m) Comment: WITHOUT SHOES  Wt 138 lb (62.6 kg)   BMI 25.65 kg/m       Objective:   Physical Exam Vitals signs and nursing note reviewed.  Constitutional:      General: She is not in acute distress.    Appearance: She is not diaphoretic.  HENT:     Head: Normocephalic and atraumatic.     Right Ear: Tympanic membrane, ear canal and external ear normal. There is no impacted cerumen.     Left Ear: Tympanic membrane, ear canal and external ear normal. There is no impacted cerumen.     Nose: Nose normal.     Mouth/Throat:     Mouth: Mucous membranes are moist.     Pharynx: Oropharynx is clear. No oropharyngeal exudate.  Eyes:     General: No scleral icterus.        Right eye: No discharge.        Left eye: No discharge.     Extraocular Movements: Extraocular movements intact.     Conjunctiva/sclera: Conjunctivae normal.     Pupils: Pupils are equal, round, and reactive to light.  Neck:     Musculoskeletal: Normal range of motion and neck supple.     Thyroid: No thyromegaly.     Vascular: No JVD.     Trachea: No  tracheal deviation.  Cardiovascular:     Rate and Rhythm: Normal rate and regular rhythm.     Heart sounds: Normal heart sounds. No murmur. No friction rub. No gallop.   Pulmonary:     Effort: Pulmonary effort is normal. No respiratory distress.     Breath sounds: Normal breath sounds. No stridor. No wheezing, rhonchi or rales.  Chest:     Chest wall: No tenderness.  Abdominal:     General: Bowel sounds are normal. There is no distension.     Palpations: Abdomen is soft. There is no mass.     Tenderness: There is no abdominal tenderness. There is no right CVA tenderness, left CVA tenderness, guarding or rebound.     Hernia: No hernia is present.  Musculoskeletal: Normal range of motion.        General: No tenderness.  Lymphadenopathy:     Cervical: No cervical adenopathy.  Skin:    General: Skin is warm and dry.     Capillary Refill: Capillary refill takes less than 2 seconds.     Coloration: Skin is not jaundiced or pale.     Findings: No bruising, erythema, lesion or rash.     Comments: Small surgical incision on left scalp   Neurological:     General: No focal deficit present.     Mental Status: She is alert and oriented to person, place, and time. Mental status is at baseline.     Cranial Nerves: No cranial nerve deficit.     Motor: No abnormal muscle tone.     Coordination: Coordination normal.     Deep Tendon Reflexes: Reflexes normal.  Psychiatric:        Mood and Affect: Mood normal.        Behavior: Behavior normal.        Thought Content: Thought content normal.        Judgment: Judgment normal.       Assessment  & Plan:  1. Diabetes 1.5, managed as type 2 ([place]) - Consider change in metformin dose  - Levetiracetam level - CBC with Differential/Platelet - Comprehensive metabolic panel - Hemoglobin A1c - Lipid panel - TSH  2. Mixed hyperlipidemia - Consider dose change in statin  - Levetiracetam level - CBC with Differential/Platelet - Comprehensive metabolic panel - Hemoglobin A1c - Lipid panel - TSH  3. Malignant neoplasm of brain, unspecified location [facility]) - Doing remarkably well.  - Continue with MRI Q6months  - Follow up with [facility] as directed  - Levetiracetam level - CBC with Differential/Platelet - Comprehensive metabolic panel - Hemoglobin A1c - Lipid panel - TSH  Dorothyann Peng, NP

## 2019-07-18 LAB — LEVETIRACETAM LEVEL: Keppra (Levetiracetam): 4 ug/mL — ABNORMAL LOW (ref 12.0–46.0)

## 2019-07-20 ENCOUNTER — Other Ambulatory Visit: Payer: Self-pay | Admitting: Adult Health

## 2019-07-20 DIAGNOSIS — E1110 Type 2 diabetes mellitus with ketoacidosis without coma: Secondary | ICD-10-CM

## 2019-07-20 MED ORDER — METFORMIN HCL ER 500 MG PO TB24: 500.0000 mg | ORAL_TABLET | Freq: Every day | ORAL | 1 refills | Status: DC

## 2019-09-08 DIAGNOSIS — C719 Malignant neoplasm of brain, unspecified: Secondary | ICD-10-CM | POA: Diagnosis not present

## 2019-09-16 DIAGNOSIS — Z923 Personal history of irradiation: Secondary | ICD-10-CM | POA: Diagnosis not present

## 2019-09-16 DIAGNOSIS — R9089 Other abnormal findings on diagnostic imaging of central nervous system: Secondary | ICD-10-CM | POA: Diagnosis not present

## 2019-09-16 DIAGNOSIS — C711 Malignant neoplasm of frontal lobe: Secondary | ICD-10-CM | POA: Diagnosis not present

## 2019-09-16 DIAGNOSIS — Z9221 Personal history of antineoplastic chemotherapy: Secondary | ICD-10-CM | POA: Diagnosis not present

## 2019-09-29 ENCOUNTER — Other Ambulatory Visit: Payer: Self-pay

## 2019-10-04 DIAGNOSIS — E119 Type 2 diabetes mellitus without complications: Secondary | ICD-10-CM | POA: Diagnosis not present

## 2019-10-04 DIAGNOSIS — H2513 Age-related nuclear cataract, bilateral: Secondary | ICD-10-CM | POA: Diagnosis not present

## 2020-01-16 ENCOUNTER — Other Ambulatory Visit: Payer: Self-pay | Admitting: Adult Health

## 2020-01-16 DIAGNOSIS — E1110 Type 2 diabetes mellitus with ketoacidosis without coma: Secondary | ICD-10-CM

## 2020-01-19 NOTE — Telephone Encounter (Signed)
Spoke to the pt.  She has moved to [place].  Has not found a PCP.  Can you fill?

## 2020-01-19 NOTE — Telephone Encounter (Signed)
DUE FOR A1C AND FOLLOW UP

## 2020-03-26 ENCOUNTER — Other Ambulatory Visit: Payer: Self-pay | Admitting: Adult Health

## 2020-03-26 DIAGNOSIS — E1110 Type 2 diabetes mellitus with ketoacidosis without coma: Secondary | ICD-10-CM

## 2020-03-29 NOTE — Telephone Encounter (Signed)
Denied.   Past due for diabetes follow up.

## 2020-05-21 ENCOUNTER — Other Ambulatory Visit: Payer: Self-pay | Admitting: Adult Health

## 2020-05-21 DIAGNOSIS — E1110 Type 2 diabetes mellitus with ketoacidosis without coma: Secondary | ICD-10-CM

## 2020-05-24 MED ORDER — METFORMIN HCL ER 500 MG PO TB24: 500.0000 mg | ORAL_TABLET | Freq: Every day | ORAL | 0 refills | Status: DC

## 2020-05-24 MED ORDER — LOVASTATIN 40 MG PO TABS: 40.0000 mg | ORAL_TABLET | Freq: Every day | ORAL | 0 refills | Status: DC

## 2020-06-18 ENCOUNTER — Other Ambulatory Visit: Payer: Self-pay | Admitting: Adult Health

## 2020-06-18 DIAGNOSIS — E1110 Type 2 diabetes mellitus with ketoacidosis without coma: Secondary | ICD-10-CM

## 2020-06-21 NOTE — Telephone Encounter (Signed)
OK TO FILL FOR 90 DAYS?

## 2020-09-19 ENCOUNTER — Other Ambulatory Visit: Payer: Self-pay | Admitting: Adult Health

## 2020-09-19 DIAGNOSIS — E1110 Type 2 diabetes mellitus with ketoacidosis without coma: Secondary | ICD-10-CM

## 2020-10-06 ENCOUNTER — Other Ambulatory Visit: Payer: Self-pay | Admitting: Adult Health

## 2020-10-06 DIAGNOSIS — E1110 Type 2 diabetes mellitus with ketoacidosis without coma: Secondary | ICD-10-CM

## 2020-11-13 ENCOUNTER — Telehealth: Payer: Self-pay | Admitting: Adult Health

## 2020-11-13 NOTE — Telephone Encounter (Signed)
Patient is calling and requesting a refill for lovastatin (MEVACOR) 40 MG tablet and metFORMIN (GLUCOPHAGE-XR) 500 MG 24 hr tablet sent to [place] [id number]

## 2020-11-14 NOTE — Telephone Encounter (Signed)
Left a message on identified voicemail informing the pt that Tommi Rumps is not able to fill these medications at this time.  He has not seen her seen 07/2019.  She will need to schedule an appointment to be seen in the office.  Left the office telephone number and advised a call back if needed.  Pt is now a resident of [place].

## 2020-11-21 ENCOUNTER — Other Ambulatory Visit: Payer: Self-pay

## 2020-11-21 ENCOUNTER — Telehealth (INDEPENDENT_AMBULATORY_CARE_PROVIDER_SITE_OTHER): Payer: Medicare Other | Admitting: Adult Health

## 2020-11-21 ENCOUNTER — Ambulatory Visit: Payer: Medicare Other | Admitting: Adult Health

## 2020-11-21 ENCOUNTER — Encounter: Payer: Self-pay | Admitting: Adult Health

## 2020-11-21 VITALS — Wt 138.0 lb

## 2020-11-21 DIAGNOSIS — E1110 Type 2 diabetes mellitus with ketoacidosis without coma: Secondary | ICD-10-CM

## 2020-11-21 DIAGNOSIS — E782 Mixed hyperlipidemia: Secondary | ICD-10-CM | POA: Diagnosis not present

## 2020-11-21 DIAGNOSIS — F419 Anxiety disorder, unspecified: Secondary | ICD-10-CM

## 2020-11-21 MED ORDER — METFORMIN HCL ER 500 MG PO TB24: 500.0000 mg | ORAL_TABLET | Freq: Every day | ORAL | 0 refills | Status: AC

## 2020-11-21 MED ORDER — LOVASTATIN 40 MG PO TABS: 40.0000 mg | ORAL_TABLET | Freq: Every day | ORAL | 0 refills | Status: AC

## 2020-11-21 MED ORDER — CITALOPRAM HYDROBROMIDE 20 MG PO TABS: 20.0000 mg | ORAL_TABLET | Freq: Every day | ORAL | 0 refills | Status: AC

## 2020-11-21 NOTE — Progress Notes (Signed)
Virtual Visit via Telephone Note  I connected with Diane Perez on 11/21/20 at  2:00 PM EST by telephone and verified that I am speaking with the correct person using two identifiers.   I discussed the limitations, risks, security and privacy concerns of performing an evaluation and management service by telephone and the availability of in person appointments. I also discussed with the patient that there may be a patient responsible charge related to this service. The patient expressed understanding and agreed to proceed.  Location patient: home Location provider: work or home office Participants present for the call: patient, provider Patient did not have a visit in the prior 7 days to address this/these issue(s).   History of Present Illness: 75 year old female who  Past Medical History:  Diagnosis Date  . Diabetes mellitus   . Glioblastoma ([place]) 2018  . Hyperlipidemia   . Osteoporosis    She is being evaluated today for medication follow up. She and her husband have moved to [place], [place] - after traveling in their RV for the last 6 years. They have an appointment with a new PCP on February 28th, 2022 but needs refills of medications until she is established.    Observations/Objective: Patient sounds cheerful and well on the phone. I do not appreciate any SOB. Speech and thought processing are grossly intact. Patient reported vitals:  Assessment and Plan: 1. Uncontrolled type 2 diabetes mellitus with ketoacidosis without coma ([place]) - Will provider 90 days of medications until they are seen by their new PCP. It has been a pleasure taking care of them over the last few years. I wish them the very best   - metFORMIN (GLUCOPHAGE-XR) 500 MG 24 hr tablet; Take 1 tablet (500 mg total) by mouth daily with breakfast.  Dispense: 90 tablet; Refill: 0  2. Mixed hyperlipidemia  - lovastatin (MEVACOR) 40 MG tablet; Take 1 tablet (40 mg total) by mouth at bedtime.  Dispense:  90 tablet; Refill: 0  3. Anxiety  - citalopram (CELEXA) 20 MG tablet; Take 1 tablet (20 mg total) by mouth daily.  Dispense: 90 tablet; Refill: 0   Follow Up Instructions:  I did not refer this patient for an OV in the next 24 hours for this/these issue(s).  I discussed the assessment and treatment plan with the patient. The patient was provided an opportunity to ask questions and all were answered. The patient agreed with the plan and demonstrated an understanding of the instructions.   The patient was advised to call back or seek an in-person evaluation if the symptoms worsen or if the condition fails to improve as anticipated.  I provided 12 minutes of non-face-to-face time during this encounter.   Dorothyann Peng, NP

## 2020-11-24 ENCOUNTER — Encounter: Payer: Self-pay | Admitting: Adult Health

## 2021-01-01 ENCOUNTER — Telehealth: Payer: Self-pay | Admitting: Adult Health

## 2021-01-01 NOTE — Telephone Encounter (Signed)
Left message for patient to call back and schedule Medicare Annual Wellness Visit (AWV) either virtually or in office. No detailed message left  Last AWV  09/05/2019  please schedule at anytime with [facility] Nurse Health Advisor 1 or 2   This should be a 45 minute visit.

## 2021-03-30 ENCOUNTER — Telehealth: Payer: Self-pay | Admitting: Adult Health

## 2021-03-30 NOTE — Telephone Encounter (Signed)
Pt called back to let us know that she is no longer living in [place] she has moved to [facility] and would like for her information removed.

## 2021-03-30 NOTE — Telephone Encounter (Signed)
Left message for patient to call back and schedule Medicare Annual Wellness Visit (AWV) either virtually or in office.   Last AWV 09/05/19 please schedule at anytime with [facility] Nurse Health Advisor 1 or 2   This should be a 45 minute visit.
